# Patient Record
Sex: Female | Born: 1965 | Race: White | Hispanic: No | Marital: Married | Smoking: Never smoker
Health system: Southern US, Community
[De-identification: ages and names within clinical notes are randomized; demographics above are authoritative.]

## PROBLEM LIST (undated history)

## (undated) DIAGNOSIS — L259 Unspecified contact dermatitis, unspecified cause: Secondary | ICD-10-CM

## (undated) DIAGNOSIS — I493 Ventricular premature depolarization: Secondary | ICD-10-CM

## (undated) DIAGNOSIS — R42 Dizziness and giddiness: Secondary | ICD-10-CM

## (undated) DIAGNOSIS — D682 Hereditary deficiency of other clotting factors: Secondary | ICD-10-CM

## (undated) DIAGNOSIS — R002 Palpitations: Secondary | ICD-10-CM

## (undated) DIAGNOSIS — E041 Nontoxic single thyroid nodule: Secondary | ICD-10-CM

## (undated) DIAGNOSIS — D6859 Other primary thrombophilia: Secondary | ICD-10-CM

## (undated) HISTORY — DX: Ventricular premature depolarization: I49.3

## (undated) HISTORY — DX: Palpitations: R00.2

## (undated) HISTORY — DX: Nontoxic single thyroid nodule: E04.1

## (undated) HISTORY — DX: Unspecified contact dermatitis, unspecified cause: L25.9

## (undated) HISTORY — DX: Hereditary deficiency of other clotting factors: D68.2

## (undated) HISTORY — DX: Other primary thrombophilia: D68.59

## (undated) HISTORY — DX: Dizziness and giddiness: R42

## (undated) HISTORY — PX: OTHER SURGICAL HISTORY: SHX169

---

## 1997-12-23 ENCOUNTER — Inpatient Hospital Stay (HOSPITAL_COMMUNITY): Admission: AD | Admit: 1997-12-23 | Discharge: 1997-12-23 | Payer: Self-pay | Admitting: Obstetrics and Gynecology

## 1998-02-16 ENCOUNTER — Other Ambulatory Visit: Admission: RE | Admit: 1998-02-16 | Discharge: 1998-02-16 | Payer: Self-pay | Admitting: Obstetrics and Gynecology

## 1999-05-23 ENCOUNTER — Other Ambulatory Visit: Admission: RE | Admit: 1999-05-23 | Discharge: 1999-05-23 | Payer: Self-pay | Admitting: Obstetrics and Gynecology

## 2000-06-13 ENCOUNTER — Other Ambulatory Visit: Admission: RE | Admit: 2000-06-13 | Discharge: 2000-06-13 | Payer: Self-pay | Admitting: Obstetrics and Gynecology

## 2001-11-25 ENCOUNTER — Other Ambulatory Visit: Admission: RE | Admit: 2001-11-25 | Discharge: 2001-11-25 | Payer: Self-pay | Admitting: Obstetrics and Gynecology

## 2003-03-17 ENCOUNTER — Other Ambulatory Visit: Admission: RE | Admit: 2003-03-17 | Discharge: 2003-03-17 | Payer: Self-pay | Admitting: Obstetrics and Gynecology

## 2003-03-31 ENCOUNTER — Ambulatory Visit (HOSPITAL_COMMUNITY): Admission: RE | Admit: 2003-03-31 | Discharge: 2003-03-31 | Payer: Self-pay | Admitting: Obstetrics and Gynecology

## 2003-05-20 ENCOUNTER — Encounter (INDEPENDENT_AMBULATORY_CARE_PROVIDER_SITE_OTHER): Payer: Self-pay | Admitting: Specialist

## 2003-05-20 ENCOUNTER — Ambulatory Visit (HOSPITAL_COMMUNITY): Admission: RE | Admit: 2003-05-20 | Discharge: 2003-05-20 | Payer: Self-pay | Admitting: Surgery

## 2004-07-06 ENCOUNTER — Other Ambulatory Visit: Admission: RE | Admit: 2004-07-06 | Discharge: 2004-07-06 | Payer: Self-pay | Admitting: Obstetrics and Gynecology

## 2004-09-26 ENCOUNTER — Encounter (INDEPENDENT_AMBULATORY_CARE_PROVIDER_SITE_OTHER): Payer: Self-pay | Admitting: *Deleted

## 2004-09-26 ENCOUNTER — Ambulatory Visit (HOSPITAL_COMMUNITY): Admission: RE | Admit: 2004-09-26 | Discharge: 2004-09-26 | Payer: Self-pay | Admitting: Obstetrics and Gynecology

## 2008-03-12 ENCOUNTER — Ambulatory Visit (HOSPITAL_COMMUNITY): Admission: RE | Admit: 2008-03-12 | Discharge: 2008-03-12 | Payer: Self-pay | Admitting: Obstetrics and Gynecology

## 2009-02-22 LAB — CONVERTED CEMR LAB

## 2010-03-22 ENCOUNTER — Ambulatory Visit: Payer: Self-pay | Admitting: Internal Medicine

## 2010-03-23 DIAGNOSIS — E041 Nontoxic single thyroid nodule: Secondary | ICD-10-CM | POA: Insufficient documentation

## 2010-03-23 DIAGNOSIS — D6859 Other primary thrombophilia: Secondary | ICD-10-CM | POA: Insufficient documentation

## 2010-03-23 DIAGNOSIS — L259 Unspecified contact dermatitis, unspecified cause: Secondary | ICD-10-CM | POA: Insufficient documentation

## 2010-05-14 ENCOUNTER — Encounter: Payer: Self-pay | Admitting: Obstetrics and Gynecology

## 2010-05-26 NOTE — Assessment & Plan Note (Signed)
Summary: new pt/uhc/#/lb   Vital Signs:  Patient profile:   45 year old female Height:      59 inches (149.86 cm) Weight:      127.4 pounds (57.91 kg) BMI:     25.82 O2 Sat:      99 % on Room air Temp:     98.1 degrees F (36.72 degrees C) oral Pulse rate:   68 / minute BP sitting:   100 / 60  (left arm) Cuff size:   regular  Vitals Entered By: Orlan Leavens RMA (March 22, 2010 3:04 PM)  O2 Flow:  Room air CC: New patient Is Patient Diabetic? No Pain Assessment Patient in pain? no        Primary Care Provider:  Newt Lukes MD  CC:  New patient.  History of Present Illness: new pt to me and our practice, here to est with PCP - follows with gyn who provides majority of medical care  c/o itchy and scaling rash affects posterior scalp line and right ear (pinna fold against scalp) +hx same - similar to prior excema flares onset current symptoms 2-3 weeks ago but also milder chronic component at hairline has not responded to topical steroid "foam" due to burnng pain when applied - inconstistent use worse with heat and direct pressure (cell phone to ear) no other rash involvment on head or trunk at this time no fever, no HA, no hx psoriasis  Preventive Screening-Counseling & Management  Alcohol-Tobacco     Alcohol drinks/day: 0     Alcohol Counseling: not indicated; patient does not drink     Smoking Status: never     Tobacco Counseling: not indicated; no tobacco use  Caffeine-Diet-Exercise     Does Patient Exercise: yes     Exercise Counseling: not indicated; exercise is adequate     Depression Counseling: not indicated; screening negative for depression  Safety-Violence-Falls     Seat Belt Counseling: not indicated; patient wears seat belts     Helmet Counseling: not indicated; patient wears helmet when riding bicycle/motocycle     Firearm Counseling: not applicable     Violence Counseling: not indicated; no violence risk noted  Clinical Review  Panels:  Prevention   Last Mammogram:  No specific mammographic evidence of malignancy.   (02/22/2009)   Last Pap Smear:  Interpretation. /Result:Negative for intraepithelial Lesion or Malignancy.    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 I have reviewed all other systems and they were negative.   Physical Exam  General:  alert, well-developed, well-nourished, and cooperative to examination.    Head:  Normocephalic and atraumatic without obvious abnormalities. No apparent alopecia or balding. Eyes:  vision grossly intact; pupils equal, round and reactive to light.  conjunctiva and lids normal.    Ears:  normal pinnae bilaterally, without erythema, swelling, mild tenderness to palpation on right pinna. TMs clear, without effusion, or cerumen impaction. Hearing grossly normal bilaterally  Mouth:  teeth  and gums in good repair; mucous membranes moist, without lesions or ulcers. oropharynx clear without exudate, no erythema.  Lungs:  normal respiratory effort, no intercostal retractions or use of accessory muscles; normal breath sounds bilaterally - no crackles and no wheezes.    Heart:  normal rate, regular rhythm, no murmur, and no rub. BLE without edema. Genitalia:  defer to gyn Skin:  scaling excema at posterior scalp line and hairline behind right ear where there is cracking and mild edema, no cellulitis or bleeding or infx Psych:  Oriented X3, memory intact for  recent and remote, normally interactive, good eye contact, not anxious appearing, not depressed appearing, and not agitated.      Impression & Recommendations:  Problem # 1:  ECZEMA (ICD-692.9)  rx for nonalcohol based steroid to lessen burn sensation Her updated medication list for this problem includes:    Olux 0.05 % Foam (Clobetasol propionate) .Marland Kitchen... Apply to scalp as needed    Triamcinolone Acetonide 0.5 % Crea (Triamcinolone acetonide) .Marland Kitchen... 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 Apply to scalp as needed 2)  Triamcinolone Acetonide 0.5 % Crea (Triamcinolone acetonide) .... Apply to ear rash three times a day as needed for itch  Patient Instructions: 1)  it was good to see you today. 2)  use triamcinolone cream for ear itch/rash - your prescription has been electronically submitted to your pharmacy. Please take as directed. Contact our office if you believe you're having problems with the medication(s).  3)  please ask dr. Arelia Sneddon to fax [place] copies of any visits and test results that he may do in future visits 4)  Please schedule a follow-up appointment as needed. Prescriptions: TRIAMCINOLONE ACETONIDE 0.5 % CREA (TRIAMCINOLONE ACETONIDE) apply to ear rash three times a day as needed for itch  #1 x 0   Entered and Authorized by:   Newt Lukes MD   Signed by:   Newt Lukes MD on 03/22/2010   Method used:   Electronically to        [facility]  [address] [phone]* (retail)       [address] [place], [place]  [address]       Ph: [phone] or [phone]       Fax: [phone]   RxID:   [id number]    Orders Added: 1)  New Patient Level III [99203] 2)  Prescription Created Electronically [phone]     Mammogram  Procedure date:  02/22/2009  Findings:      No specific mammographic evidence of malignancy.    Pap Smear  Procedure date:  02/22/2009  Findings:      Interpretation. /Result:Negative for intraepithelial Lesion or Malignancy.

## 2010-09-09 NOTE — Op Note (Signed)
Veronica Chen, Veronica Chen                ACCOUNT NO.:  [id number]   MEDICAL RECORD NO.:  [id number]          PATIENT TYPE:  AMB   LOCATION:  SDC                           FACILITY:  [facility]   PHYSICIAN:  Juluis Mire, M.D.   DATE OF BIRTH:  [birth year]   DATE OF PROCEDURE:  09/26/2004  DATE OF DISCHARGE:                                 OPERATIVE REPORT   PREOPERATIVE DIAGNOSES:  1.  Menorrhagia.  2.  Uterine subseptum.   POSTOPERATIVE DIAGNOSES:  1.  Menorrhagia.  2.  Uterine subseptum.   OPERATIVE PROCEDURE:  1.  Hysteroscopy.  2.  Endometrial curettings.  3.  NovaSure endometrial ablation.   ANESTHESIA:  Sedation with paracervical block.   ESTIMATED BLOOD LOSS:  Minimal.   PACKS AND DRAINS:  None.   INTRAOPERATIVE BLOOD LOSS:  None.   COMPLICATIONS:  None.   INDICATION:  The indications are dictated in the history and physical.   PROCEDURE:  The patient was taken to the OR and placed in supine position.  After sedation was placed, the patient was placed in the dorsal supine  position using the Allen stirrups.  At this point in time, the patient was  draped in a sterile field.  Speculum was placed in the vaginal vault and the  cervix and vagina cleansed with Betadine.  A paracervical block was  instituted using 1% Nesacaine.  The cervix was grasped with a single-tooth  tenaculum.  The uterus sounded to approximately 9 cm.  Cervical length was 4  cm.  The cervix was serially dilated to a size 27 Pratt dilator.  Nonoperative hysteroscope was then introduced and the intrauterine cavity  was distended using sorbitol.  There was a small fundal subseptum.  Otherwise, the intrauterine cavity was unremarkable.  Endometrial curettings  were then obtained.  NovaSure was set to appropriate length of 5 cm.  It was  inserted and appropriately deployed.  After manipulation, the cavity width  was 5 cm.  We passed the CO2 test.  We subsequently ablated to a power of  138 for 102 seconds.   The hysteroscope was then removed intact.  Repeat  hysteroscopy revealed both cornua to have been adequately ablated despite  the septum.  At this point in time, the procedure was terminated.  The  speculum and  single-tooth tenaculum were removed.  The patient was taken out of the  dorsal supine position.  Once alert, she was transferred to recovery in good  condition.  Sponge, instrument, and needle count reported as correct by the  circulating nurse x2.       JSM/MEDQ  D:  09/26/2004  T:  09/26/2004  Job:  [id number]

## 2010-09-09 NOTE — H&P (Signed)
NAMEADYSON, VANBUREN NO.:  [id number]   MEDICAL RECORD NO.:  [id number]          PATIENT TYPE:  AMB   LOCATION:  SDC                           [facility]ILITY:  [facility]   PHYSICIAN:  Juluis Mire, M.D.   DATE OF BIRTH:  April 17, 1966   DATE OF ADMISSION:  09/26/2004  DATE OF DISCHARGE:                                HISTORY & PHYSICAL   The patient is a 45 year old gravida 2, para 2, married white female, who  presents for hysteroscopy, as well as NovaSure ablation.   In relation to present admission, cycles last from 7-10 days.  They are  heavy and prolonged.  Her hemoglobin has remained stable.  She denies any  pelvic pain or discomfort.  A saline infusion ultrasound was basically  unremarkable.  She she does have a positive subseptate uterus and findings  highly suggestive of adenomyosis.  The patient has a history of protein S  deficiency, therefore hormonal agents are contraindicated.  After discussion  of options, the patient now presents for endometrial ablation.  She does  understand with the septum, success rates may be slightly lessened.  After  the ablation, we may try to go in with the roller ball and ablate the  corneal areas.   In terms of allergies, the patient has no known drug allergies.   MEDICATIONS:  Minocycline for acne.   PAST MEDICAL HISTORY:  Usual childhood diseases.  Does have a history of protein S deficiency.  Also a history of thyroid nodule under active management.   PAST SURGICAL HISTORY:  She has had dermatological surgery.  She has had two previous C sections, the last one with a bilateral tubal  ligation.   FAMILY HISTORY:  Noncontributory.   SOCIAL HISTORY:  No tobacco or alcohol use.   REVIEW OF SYSTEMS:  Noncontributory.   PHYSICAL EXAMINATION:  GENERAL:  The patient is afebrile with stable vital  signs.  HEENT:  The patient is normocephalic.  Pupils are equal, round and reactive  to light and accommodation.  Extraocular  movements are intact.  Sclerae and  conjunctivae clear.  Oropharynx clear.  NECK:  Some thyroid nodularity, otherwise unremarkable.  LUNGS:  Clear.  HEART:  Regular rate.  No murmurs or gallops.  ABDOMEN:  Exam is benign.  No mass, organomegaly or tenderness.  PELVIC:  Normal external genitalia.  Vaginal mucosa clear.  Cervix  unremarkable.  Uterus normal size, shape and contour.  Adnexa re free of  masses or tenderness.  EXTREMITIES:  Trace edema.  NEUROLOGIC:  Exam is grossly within normal limits.   IMPRESSION:  1.  Menorrhagia, probable adenomyosis.  2.  Protein S deficiency.  3.  Thyroid nodule.   PLAN:  The patient will undergo the above noted surgery.  The risks of  surgery have been discussed including the risk of infection, the risk of  hemorrhage that could require transfusion with the possible need of  hysterectomy, the risk of injury to adjacent organs through perforation,  specifically bowel that could require further exploratory surgery, the risk  of deep venous thrombosis and pulmonary  embolus.  Success rates of 89% are  quoted.  The patient appears to understand the risks.       JSM/MEDQ  D:  09/26/2004  T:  09/26/2004  Job:  [id number]

## 2010-09-21 ENCOUNTER — Other Ambulatory Visit (HOSPITAL_COMMUNITY): Payer: Self-pay | Admitting: Obstetrics and Gynecology

## 2010-09-21 DIAGNOSIS — E041 Nontoxic single thyroid nodule: Secondary | ICD-10-CM

## 2010-09-23 ENCOUNTER — Other Ambulatory Visit (HOSPITAL_COMMUNITY): Payer: Self-pay

## 2011-08-23 ENCOUNTER — Encounter: Payer: Self-pay | Admitting: Internal Medicine

## 2011-08-23 ENCOUNTER — Ambulatory Visit (INDEPENDENT_AMBULATORY_CARE_PROVIDER_SITE_OTHER): Payer: 59 | Admitting: Internal Medicine

## 2011-08-23 VITALS — BP 100/78 | HR 79 | Temp 98.2°F | Ht 59.5 in | Wt 125.0 lb

## 2011-08-23 DIAGNOSIS — H6120 Impacted cerumen, unspecified ear: Secondary | ICD-10-CM

## 2011-08-23 DIAGNOSIS — H6121 Impacted cerumen, right ear: Secondary | ICD-10-CM

## 2011-08-23 DIAGNOSIS — R42 Dizziness and giddiness: Secondary | ICD-10-CM

## 2011-08-23 NOTE — Patient Instructions (Signed)
It was good to see you today. we will send to your prior provider(s) at [facility] for "release of records" as discussed today -  Your ears have been irrigated of wax today -let us know if continued dizzy spells or hearing problems persist for referral to audiologist and hearing testing  Benign Positional Vertigo Vertigo means you feel like you or your surroundings are moving when they are not. Benign positional vertigo is the most common form of vertigo. Benign means that the cause of your condition is not serious. Benign positional vertigo is more common in older adults. CAUSES   Benign positional vertigo is the result of an upset in the labyrinth system. This is an area in the middle ear that helps control your balance. This may be caused by a viral infection, head injury, or repetitive motion. However, often no specific cause is found. SYMPTOMS   Symptoms of benign positional vertigo occur when you move your head or eyes in different directions. Some of the symptoms may include:  Loss of balance and falls.   Vomiting.   Blurred vision.   Dizziness.   Nausea.   Involuntary eye movements (nystagmus).  DIAGNOSIS   Benign positional vertigo is usually diagnosed by physical exam. If the specific cause of your benign positional vertigo is unknown, your caregiver may perform imaging tests, such as magnetic resonance imaging (MRI) or computed tomography (CT). TREATMENT   Your caregiver may recommend movements or procedures to correct the benign positional vertigo. Medicines such as meclizine, benzodiazepines, and medicines for nausea may be used to treat your symptoms. In rare cases, if your symptoms are caused by certain conditions that affect the inner ear, you may need surgery. HOME CARE INSTRUCTIONS    Follow your caregiver's instructions.   Move slowly. Do not make sudden body or head movements.   Avoid driving.   Avoid operating heavy machinery.   Avoid performing any tasks  that would be dangerous to you or others during a vertigo episode.   Drink enough fluids to keep your urine clear or pale yellow.  SEEK IMMEDIATE MEDICAL CARE IF:    You develop problems with walking, weakness, numbness, or using your arms, hands, or legs.   You have difficulty speaking.   You develop severe headaches.   Your nausea or vomiting continues or gets worse.   You develop visual changes.   Your family or friends notice any behavioral changes.   Your condition gets worse.   You have a fever.   You develop a stiff neck or sensitivity to light.  MAKE SURE YOU:    Understand these instructions.   Will watch your condition.   Will get help right away if you are not doing well or get worse.  Document Released: 01/16/2006 Document Revised: 03/30/2011 Document Reviewed: 12/29/2010 [facility] Patient Information 2012 [place], [place].

## 2011-08-23 NOTE — Progress Notes (Signed)
  Subjective:    Patient ID: Veronica Chen, female    DOB: 1965-12-12, 46 y.o.   MRN: [id number]  HPI  complains of dizzy spell last week Hx same spring 2012 and severe spell in 2011 associated with mild nausea - no headache or weakness symptoms last <3 hours -  Spontaneous onset and gradual resolution without intervention Prior meds in 2011 (meclizine) ineffective Feels "full" on r side of ear  Past Medical History  Diagnosis Date  . 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 ECZEMA   . THYROID NODULE     Review of Systems  Constitutional: Negative for fever.  HENT: Negative for ear pain and tinnitus.   Respiratory: Negative for cough and wheezing.   Cardiovascular: Negative for chest pain, palpitations and leg swelling.  Neurological: Negative for syncope and facial asymmetry.       Objective:   Physical Exam BP 100/78  Pulse 79  Temp(Src) 98.2 F (36.8 C) (Oral)  Ht 4' 11.5" (1.511 m)  Wt 125 lb (56.7 kg)  BMI 24.82 kg/m2  SpO2 97% Wt Readings from Last 3 Encounters:  08/23/11 125 lb (56.7 kg)  03/22/10 127 lb 6.4 oz (57.788 kg)   Constitutional: She appears well-developed and well-nourished. No distress.  HENT: Head: Normocephalic and atraumatic. Ears: R ear with obstructing cerumen; after irrigation, B TMs ok, no erythema or effusion; Nose: Nose normal. Mouth/Throat: Oropharynx is clear and moist. No oropharyngeal exudate.  Eyes: Conjunctivae and EOM are normal. Pupils are equal, round, and reactive to light. No scleral icterus.  Neck: Normal range of motion. Neck supple. No JVD present. No thyromegaly present.  Cardiovascular: Normal rate, regular rhythm and normal heart sounds.  No murmur heard. No BLE edema. Pulmonary/Chest: Effort normal and breath sounds normal. No respiratory distress. She has no wheezes.  Neurological: She is alert and oriented to person, place, and time. No cranial nerve deficit. Coordination  normal.  Psychiatric: She has a normal mood and affect. Her behavior is normal. Judgment and thought content normal.   No results found for this basename: WBC, HGB, HCT, PLT, GLUCOSE, CHOL, TRIG, HDL, LDLDIRECT, LDLCALC, ALT, AST, NA, K, CL, CREATININE, BUN, CO2, TSH, PSA, INR, GLUF, HGBA1C, MICROALBUR   Procedure: wax removal Reason: wax impaction Risks and benefits of procedure discussed with the patient who agrees to proceed. Ear(s) irrigated with warm water. Large amount of wax removed. Instrumentation with metal ear loop was performed to accomplish wax removal. the patient tolerated procedure well.     Assessment & Plan:  Dizzy - suspect BPPV vs symptomatic cerumen - declines meds.  S/p irrigation - education povided Pt to call if symptoms worse or frequent recurrent

## 2011-08-30 ENCOUNTER — Encounter: Payer: Self-pay | Admitting: *Deleted

## 2011-10-31 ENCOUNTER — Ambulatory Visit (INDEPENDENT_AMBULATORY_CARE_PROVIDER_SITE_OTHER): Payer: 59 | Admitting: Internal Medicine

## 2011-10-31 ENCOUNTER — Encounter: Payer: Self-pay | Admitting: Internal Medicine

## 2011-10-31 VITALS — BP 102/72 | HR 73 | Temp 98.4°F | Ht 60.0 in | Wt 128.8 lb

## 2011-10-31 DIAGNOSIS — L301 Dyshidrosis [pompholyx]: Secondary | ICD-10-CM

## 2011-10-31 MED ORDER — CLOBETASOL PROPIONATE 0.05 % EX CREA: TOPICAL_CREAM | Freq: Two times a day (BID) | CUTANEOUS | Status: AC

## 2011-10-31 NOTE — Progress Notes (Signed)
  Subjective:    Patient ID: Veronica Chen, female    DOB: 04-Dec-1965, 46 y.o.   MRN: [id number]  HPI complains of itchy sensation and "water bumbs" around toenail of R 3rd toe tip ?associated with accidental nail injury or flea exposure Onset 4 days ago Not improved with topical antifungal No other skin rash, itch or hx same  Past Medical History  Diagnosis Date  . 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 ECZEMA   . THYROID NODULE   . Palpitations   . PVC's (premature ventricular contractions)   . Vertigo   . Protein S deficiency     Review of Systems  Constitutional: Negative for fever.  Musculoskeletal: Negative for joint swelling.       Objective:   Physical Exam Gen: NAD, nontoxic Skin: distal R 3rd toe, dorsal surface with dishydrotic eczema  Lesions- no fungal change or cellulitis, no soft tissue swelling or darkening       Assessment & Plan:  Eczema, dyshidrotic - hx eczema - tx topical steroid - erx done and reassurance provided

## 2011-10-31 NOTE — Patient Instructions (Addendum)
It was good to see you today.  Steroid cream for your toe - Your prescription(s) have been submitted to your pharmacy. Please take as directed and contact our office if you believe you are having problem(s) with the medication(s).

## 2012-01-03 ENCOUNTER — Telehealth: Payer: Self-pay | Admitting: Internal Medicine

## 2012-01-03 NOTE — Telephone Encounter (Signed)
Caller: Ryver/Patient; Patient Name: Veronica Chen, Veronica Chen; PCP: Rene Paci (Adults only); Best Callback Phone Number: [phone];  Last Menstrual Period: 12/22/11. Patient states she developed allergy symptoms of itchy eyes, runny nose. Onset X 2 weeks. Afebrile. Patient states she started a generic, over the counter, 24 hour allergy/decongestant medication 01/01/12. Patient states she noted symptoms of rapid, irregular heart beat, describes as a "flutter". Onset 01/02/12. States symptoms are intermittent, lasting "just a second." Patient denies chest pain, dizziness or shortness of breath. Patient denies irregular or rapid heart beat at present. Patient states she was previously evaluated for irregular heart rate, approximately 3 years ago. Triage per Allergies and Irregular Heart Beat Protocol. No emergent symptoms identified. Care advice given per guidelines related to positive triage assessment for " Symptoms have not responded to several days of home care" and Patient has " unexplianed nervousness and pulse is irregular." Patient advised to avoid use of over the counter decongestants. Patient advised saline nasal washes/netty pot, inhaled steam, humidifier. Patient advised increased fluids, change positions slowly. Call back parameters reviewed. Patient verbalizes understanding. Patient states sh has an appointment scheduled for 01/04/12 0915 with Dr Felicity Coyer.

## 2012-01-04 ENCOUNTER — Encounter: Payer: Self-pay | Admitting: Internal Medicine

## 2012-01-04 ENCOUNTER — Ambulatory Visit (INDEPENDENT_AMBULATORY_CARE_PROVIDER_SITE_OTHER): Payer: 59 | Admitting: Internal Medicine

## 2012-01-04 VITALS — BP 110/70 | HR 89 | Temp 97.0°F | Resp 14 | Wt 128.0 lb

## 2012-01-04 DIAGNOSIS — J309 Allergic rhinitis, unspecified: Secondary | ICD-10-CM | POA: Insufficient documentation

## 2012-01-04 DIAGNOSIS — R002 Palpitations: Secondary | ICD-10-CM

## 2012-01-04 DIAGNOSIS — M899 Disorder of bone, unspecified: Secondary | ICD-10-CM

## 2012-01-04 DIAGNOSIS — M8580 Other specified disorders of bone density and structure, unspecified site: Secondary | ICD-10-CM | POA: Insufficient documentation

## 2012-01-04 MED ORDER — LORATADINE 10 MG PO TABS: 10.0000 mg | ORAL_TABLET | Freq: Every day | ORAL | Status: DC

## 2012-01-04 NOTE — Assessment & Plan Note (Signed)
Seasonal symptoms Palpitations with decongestant component of claritinD 24-hour Advised antihistamine without decongestant component Okay for short acting pseudoephedrine if needed for congestion symptoms

## 2012-01-04 NOTE — Patient Instructions (Addendum)
It was good to see you today. Her EKG and her tracing is perfectly normal Your patient decongestant and allergy medicine, this is a normal side effect for some people. For your allergy symptoms, use plain Claritin Zyrtec or Allegra - avoid "decongestants"  Unless short acting Sudafed (red pill) After this fall, we will follow your thyroid and physical labs needs here (rather than at gynecology) Please schedule followup in 1 year, call sooner if problems. Palpitations   A palpitation is the feeling that your heartbeat is irregular or is faster than normal. Although this is frightening, it usually is not serious. Palpitations may be caused by excesses of smoking, caffeine, or alcohol. They are also brought on by stress and anxiety. Sometimes, they are caused by heart disease. Unless otherwise noted, your caregiver did not find any signs of serious illness at this time. HOME CARE INSTRUCTIONS   To help prevent palpitations:  Drink decaffeinated coffee, tea, and soda pop. Avoid chocolate.   If you smoke or drink alcohol, quit or cut down as much as possible.   Reduce your stress or anxiety level. Biofeedback, yoga, or meditation will help you relax. Physical activity such as swimming, jogging, or walking also may be helpful.  SEEK MEDICAL CARE IF:    You continue to have a fast heartbeat.   Your palpitations occur more often.  SEEK IMMEDIATE MEDICAL CARE IF: You develop chest pain, shortness of breath, severe headache, dizziness, or fainting. Document Released: 04/07/2000 Document Revised: 03/30/2011 Document Reviewed: 06/07/2007 [facility] Patient Information 2012 [place], [place].

## 2012-01-04 NOTE — Progress Notes (Signed)
  Subjective:    Patient ID: Veronica Chen, female    DOB: Mar 26, 1966, 46 y.o.   MRN: [id number]  Palpitations  This is a recurrent problem. The current episode started in the past 7 days. The problem occurs intermittently. The problem has been rapidly improving. On average, each episode lasts 3 seconds. The symptoms are aggravated by pseudoephedrine and stress. Pertinent negatives include no anxiety, chest pain, coughing, diaphoresis, dizziness, fever, malaise/fatigue, near-syncope, shortness of breath or weakness. Treatments tried: Discontinuation of decongestant. The treatment provided significant relief. Risk factors include no known risk factors. There is no history of anemia, anxiety, heart disease, hyperthyroidism or a valve disorder.     Past Medical History  Diagnosis Date  . 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 ECZEMA   . THYROID NODULE   . Palpitations   . PVC's (premature ventricular contractions)   . Vertigo   . Protein S deficiency     Review of Systems  Constitutional: Negative for fever, malaise/fatigue and diaphoresis.  HENT: Positive for sneezing and postnasal drip. Negative for ear pain, congestion, sinus pressure and tinnitus.   Respiratory: Negative for cough, shortness of breath and wheezing.   Cardiovascular: Positive for palpitations. Negative for chest pain, leg swelling and near-syncope.  Neurological: Negative for dizziness, syncope, facial asymmetry and weakness.       Objective:   Physical Exam  BP 110/70  Pulse 89  Temp 97 F (36.1 C) (Oral)  Resp 14  Wt 128 lb (58.06 kg)  SpO2 98% Wt Readings from Last 3 Encounters:  01/04/12 128 lb (58.06 kg)  10/31/11 128 lb 12.8 oz (58.423 kg)  08/23/11 125 lb (56.7 kg)   Constitutional: She appears well-developed and well-nourished. No distress.  Neck: Normal range of motion. Neck supple. No JVD present. No thyromegaly present.  Cardiovascular: Normal rate,  regular rhythm and normal heart sounds - single PVC auscultated corresponding with patient's sensation of flutter.  No murmur heard. No BLE edema. Pulmonary/Chest: Effort normal and breath sounds normal. No respiratory distress. She has no wheezes.  Neurological: She is alert and oriented to person, place, and time. No cranial nerve deficit. Coordination normal.  Psychiatric: She has a normal mood and affect. Her behavior is normal. Judgment and thought content normal.   No results found for this basename: WBC,  HGB,  HCT,  PLT,  GLUCOSE,  CHOL,  TRIG,  HDL,  LDLDIRECT,  LDLCALC,  ALT,  AST,  NA,  K,  CL,  CREATININE,  BUN,  CO2,  TSH,  PSA,  INR,  GLUF,  HGBA1C,  MICROALBUR   ECG: Normal sinus rhythm at 81 bpm. No ischemic change    Assessment & Plan:  Symptomatic palpitations and PVCs - current episode precipitated by extended release decongestant and allergy medication. Symptoms resolved in past 24 hours since discontinuation of Claritin-D 24h. ECG reviewed. The patient is reassured that these symptoms do not appear to represent a serious or threatening condition.  See problem list. Medications and labs reviewed today.

## 2012-01-04 NOTE — Assessment & Plan Note (Signed)
Patient reports mild bone loss on DEXA at gynecology, reversed to normal with increase in weightbearing exercise We'll ask gynecology to send copy of last DEXA, follow here as needed if no longer following with gynecology

## 2012-02-16 ENCOUNTER — Other Ambulatory Visit (HOSPITAL_COMMUNITY): Payer: Self-pay | Admitting: Obstetrics and Gynecology

## 2012-02-16 DIAGNOSIS — E041 Nontoxic single thyroid nodule: Secondary | ICD-10-CM

## 2012-02-21 ENCOUNTER — Ambulatory Visit (HOSPITAL_COMMUNITY)
Admission: RE | Admit: 2012-02-21 | Discharge: 2012-02-21 | Disposition: A | Discharge status: Home / Self Care | Payer: 59 | Source: Ambulatory Visit | Attending: Obstetrics and Gynecology | Admitting: Obstetrics and Gynecology

## 2012-02-21 DIAGNOSIS — E049 Nontoxic goiter, unspecified: Secondary | ICD-10-CM | POA: Insufficient documentation

## 2012-02-21 DIAGNOSIS — E041 Nontoxic single thyroid nodule: Secondary | ICD-10-CM

## 2012-03-19 ENCOUNTER — Telehealth: Payer: Self-pay | Admitting: *Deleted

## 2012-03-19 ENCOUNTER — Ambulatory Visit (INDEPENDENT_AMBULATORY_CARE_PROVIDER_SITE_OTHER): Payer: 59 | Admitting: Internal Medicine

## 2012-03-19 ENCOUNTER — Encounter: Payer: Self-pay | Admitting: Internal Medicine

## 2012-03-19 VITALS — BP 118/84 | HR 100 | Temp 98.2°F | Ht 60.0 in | Wt 125.0 lb

## 2012-03-19 MED ORDER — LIDOCAINE VISCOUS 2 % MT SOLN: 20.0000 mL | OROMUCOSAL | Status: DC | PRN

## 2012-03-19 MED ORDER — RANITIDINE HCL 150 MG PO TABS: 150.0000 mg | ORAL_TABLET | Freq: Two times a day (BID) | ORAL | Status: DC

## 2012-03-19 MED ORDER — LIDOCAINE HCL 2 % EX GEL: Freq: Three times a day (TID) | CUTANEOUS | Status: DC

## 2012-03-19 MED ORDER — SUCRALFATE 1 GM/10ML PO SUSP: 1.0000 g | Freq: Four times a day (QID) | ORAL | Status: DC

## 2012-03-19 NOTE — Telephone Encounter (Signed)
Pt states saw md this am. Gave 3 rx's wanted to verify if the lidocaine should be topical because pt states md told her when she swallow medicine feel like she going to dentist. Should lidocaine be topical or solution...lmb

## 2012-03-19 NOTE — Telephone Encounter (Signed)
Pt called again requesting clarification on lidocaine solution after pharmacist advised pt not to take. After speaking with pharmacist Jonny Ruiz I was informed that he incorrectly advised pt not to take corrected Rx for Lidocaine Viscous solution because he was not accustomed to dispensing it undiluted but he would contact the patient to discuss. Pt advised of same and expressed understanding.

## 2012-03-19 NOTE — Telephone Encounter (Signed)
Thanks for the note - please contact the pharmacy as i sent the wrong lidocaine 1st time: it should be viscous lidocaine solution, not topical gel. i have re-sent the correct viscous lidocaine to [facility] - please let them and pt know same  thanks!

## 2012-03-19 NOTE — Progress Notes (Signed)
  Subjective:    Patient ID: Veronica Chen, female    DOB: 1965/11/12, 46 y.o.   MRN: [id number]  HPI  Complains of substernal chest pain Onset 72 hours ago Associated with painful swallowing Precipitated by single tablet of doxycycline taken 6 hours prior to symptom onset No prior history of same chest pain No oral pain or sore throat, no mouth lesion No fever, cough, shortness of breath No nausea, vomiting or abdominal pain No dysphasia but increasing odynophagia last 48 hours Symptoms not relieved with over-the-counter TUMS or Maalox  Past Medical History  Diagnosis Date  . 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 ECZEMA   . THYROID NODULE   . Palpitations   . PVC's (premature ventricular contractions)   . Vertigo   . Protein S deficiency    Review of Systems  Constitutional: Negative for fever and fatigue.  Respiratory: Negative for cough, shortness of breath, wheezing and stridor.   Gastrointestinal: Negative for abdominal pain and blood in stool.       Objective:   Physical Exam BP 118/84  Pulse 100  Temp 98.2 F (36.8 C) (Oral)  Ht 5' (1.524 m)  Wt 125 lb (56.7 kg)  BMI 24.41 kg/m2  SpO2 99%  LMP 03/04/2012 Wt Readings from Last 3 Encounters:  03/19/12 125 lb (56.7 kg)  01/04/12 128 lb (58.06 kg)  10/31/11 128 lb 12.8 oz (58.423 kg)   Constitutional: She appears well-developed and well-nourished. No distress.  HENT: Head: Normocephalic and atraumatic.  Mouth/Throat: Oropharynx is clear and moist. No oropharyngeal exudate.  Eyes: Conjunctivae and EOM are normal. Pupils are equal, round, and reactive to light. No scleral icterus.  Neck: Normal range of motion. Neck supple. No JVD or LAD present. No thyromegaly present.  Cardiovascular: Normal rate, regular rhythm and normal heart sounds.  No murmur heard. No BLE edema. Pulmonary/Chest: Effort normal and breath sounds normal. No respiratory distress. She has no  wheezes.  Abdominal: Soft. Bowel sounds are normal. She exhibits no distension. There is no tenderness. 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

## 2012-03-19 NOTE — Telephone Encounter (Signed)
Called pt back she didn't pick first lidocaine rx up. Inform her [facility]...lmb

## 2012-03-19 NOTE — Patient Instructions (Signed)
It was good to see you today. Carafate 4x/day x 1 week, zantac 2x/day x 1 week and lidocaine gel if needed for pain Your prescription(s) have been submitted to your pharmacy. Please take as directed and contact our office if you believe you are having problem(s) with the medication(s). No more doxycycline Esophagitis Esophagitis is inflammation of the esophagus. It can involve swelling, soreness, and pain in the esophagus. This condition can make it difficult and painful to swallow. CAUSES   Most causes of esophagitis are not serious. Many different factors can cause esophagitis, including:  Gastroesophageal reflux disease (GERD). This is when acid from your stomach flows up into the esophagus.   Recurrent vomiting.   An allergic-type reaction.   Certain medicines, especially those that come in large pills.   Ingestion of harmful chemicals, such as household cleaning products.   Heavy alcohol use.   An infection of the esophagus.   Radiation treatment for cancer.   Certain diseases such as sarcoidosis, Crohn's disease, and scleroderma. These diseases may cause recurrent esophagitis.  SYMPTOMS    Trouble swallowing.   Painful swallowing.   Chest pain.   Difficulty breathing.   Nausea.   Vomiting.   Abdominal pain.  DIAGNOSIS   Your caregiver will take your history and do a physical exam. Depending upon what your caregiver finds, certain tests may also be done, including:  Barium X-ray. You will drink a solution that coats the esophagus, and X-rays will be taken.   Endoscopy. A lighted tube is put down the esophagus so your caregiver can examine the area.   Allergy tests. These can sometimes be arranged through follow-up visits.  TREATMENT   Treatment will depend on the cause of your esophagitis. In some cases, steroids or other medicines may be given to help relieve your symptoms or to treat the underlying cause of your condition. Medicines that may be recommended  include:  Viscous lidocaine, to soothe the esophagus.   Antacids.   Acid reducers.   Proton pump inhibitors.   Antiviral medicines for certain viral infections of the esophagus.   Antifungal medicines for certain fungal infections of the esophagus.   Antibiotic medicines, depending on the cause of the esophagitis.  HOME CARE INSTRUCTIONS    Avoid foods and drinks that seem to make your symptoms worse.   Eat small, frequent meals instead of large meals.   Avoid eating for the 3 hours prior to your bedtime.   If you have trouble taking pills, use a pill splitter to decrease the size and likelihood of the pill getting stuck or injuring the esophagus on the way down. Drinking water after taking a pill also helps.   Stop smoking if you smoke.   Maintain a healthy weight.   Wear loose-fitting clothing. Do not wear anything tight around your waist that causes pressure on your stomach.   Raise the head of your bed 6 to 8 inches with wood blocks to help you sleep. Extra pillows will not help.   Only take over-the-counter or prescription medicines as directed by your caregiver.  SEEK IMMEDIATE MEDICAL CARE IF:  You have severe chest pain that radiates into your arm, neck, or jaw.   You feel sweaty, dizzy, or lightheaded.   You have shortness of breath.   You vomit blood.   You have difficulty or pain with swallowing.   You have bloody or black, tarry stools.   You have a fever.   You have a burning sensation in  the chest more than 3 times a week for more than 2 weeks.   You cannot swallow, drink, or eat.   You drool because you cannot swallow your saliva.  MAKE SURE YOU:  Understand these instructions.   Will watch your condition.   Will get help right away if you are not doing well or get worse.  Document Released: 05/18/2004 Document Revised: 07/03/2011 Document Reviewed: 12/09/2010 [facility] Patient Information 2013 [place], [place].

## 2012-03-20 ENCOUNTER — Telehealth: Payer: Self-pay | Admitting: *Deleted

## 2012-03-20 NOTE — Telephone Encounter (Signed)
Pt call she is still concern about lidocaine rx that was sent in. Pt states she thought it would be like a mouth wash, but the solution is so thick like honey there is no way she can use med. She think pharmacy gave her wrong thing. Want to know what does med suppose to look like solution...lmb

## 2012-03-20 NOTE — Telephone Encounter (Signed)
Notified pt with md response.../lmb 

## 2012-03-20 NOTE — Telephone Encounter (Signed)
Viscous lidocaine is the consistency of honey.  This is the appropriate medication to swallow if needed for control of pain, but I am hopeful this medication will not be needed if she finds relief with Carafate slurry and Zantac as prescribed

## 2013-01-14 ENCOUNTER — Other Ambulatory Visit: Payer: Self-pay | Admitting: Podiatry

## 2013-01-14 DIAGNOSIS — M948X9 Other specified disorders of cartilage, unspecified sites: Secondary | ICD-10-CM

## 2013-01-21 ENCOUNTER — Ambulatory Visit
Admission: RE | Admit: 2013-01-21 | Discharge: 2013-01-21 | Disposition: A | Discharge status: Home / Self Care | Payer: 59 | Source: Ambulatory Visit | Attending: Podiatry | Admitting: Podiatry

## 2013-01-21 DIAGNOSIS — M948X9 Other specified disorders of cartilage, unspecified sites: Secondary | ICD-10-CM

## 2013-01-27 ENCOUNTER — Telehealth: Payer: Self-pay | Admitting: *Deleted

## 2013-01-27 NOTE — Telephone Encounter (Signed)
MRI results request.  I pulled results hard copy for Dr. Al Corpus, and informed pt, he would review and I would call with instructions.

## 2013-01-28 ENCOUNTER — Telehealth: Payer: Self-pay | Admitting: *Deleted

## 2013-01-28 NOTE — Telephone Encounter (Signed)
Pt states didn't like having to make an appt without knowing if the MRI results were abnormal.  I informed pt that Dr Al Corpus had requested she be seen in office to discus results and therapies, that the results were abnormal.

## 2013-02-04 ENCOUNTER — Encounter: Payer: Self-pay | Admitting: Podiatry

## 2013-02-04 ENCOUNTER — Ambulatory Visit (INDEPENDENT_AMBULATORY_CARE_PROVIDER_SITE_OTHER): Payer: 59 | Admitting: Podiatry

## 2013-02-04 VITALS — BP 110/66 | HR 81 | Resp 16 | Ht 60.0 in | Wt 128.0 lb

## 2013-02-04 DIAGNOSIS — M2580 Other specified joint disorders, unspecified joint: Secondary | ICD-10-CM

## 2013-02-04 DIAGNOSIS — M948X9 Other specified disorders of cartilage, unspecified sites: Secondary | ICD-10-CM

## 2013-02-04 DIAGNOSIS — M7750 Other enthesopathy of unspecified foot: Secondary | ICD-10-CM

## 2013-02-05 NOTE — Progress Notes (Signed)
Veronica Chen presents today to go over her MRI left foot, first metatarsophalangeal joint pain. 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 The radiologist read the MRI does relate osteoarthritis to the first metatarsophalangeal joint in general. He did seems very early. I discussed this in great detail with Mr. and Mrs. Birden today. I did discuss conservative versus surgical options with her. At this point she's going to continue anti-inflammatories as needed and she will work through the pain. She does relate that the pain is improving over the past few months. We did discuss surgical intervention consisting of tibial sesamoid excision should it become necessary. I did explain to her that the longer she has without surgery the worst this joint will become.  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

## 2013-02-26 ENCOUNTER — Ambulatory Visit: Payer: 59

## 2013-02-26 ENCOUNTER — Ambulatory Visit (INDEPENDENT_AMBULATORY_CARE_PROVIDER_SITE_OTHER): Payer: 59 | Admitting: *Deleted

## 2013-02-26 DIAGNOSIS — Z23 Encounter for immunization: Secondary | ICD-10-CM

## 2013-05-23 ENCOUNTER — Ambulatory Visit: Payer: 59 | Admitting: Internal Medicine

## 2013-08-13 ENCOUNTER — Ambulatory Visit: Payer: 59 | Admitting: Internal Medicine

## 2013-08-18 ENCOUNTER — Ambulatory Visit (INDEPENDENT_AMBULATORY_CARE_PROVIDER_SITE_OTHER)
Admission: RE | Admit: 2013-08-18 | Discharge: 2013-08-18 | Disposition: A | Discharge status: Home / Self Care | Payer: 59 | Source: Ambulatory Visit | Attending: Internal Medicine | Admitting: Internal Medicine

## 2013-08-18 ENCOUNTER — Other Ambulatory Visit (INDEPENDENT_AMBULATORY_CARE_PROVIDER_SITE_OTHER): Payer: 59

## 2013-08-18 ENCOUNTER — Encounter: Payer: Self-pay | Admitting: Internal Medicine

## 2013-08-18 ENCOUNTER — Ambulatory Visit (INDEPENDENT_AMBULATORY_CARE_PROVIDER_SITE_OTHER): Payer: 59 | Admitting: Internal Medicine

## 2013-08-18 VITALS — BP 112/82 | HR 84 | Temp 98.8°F | Wt 133.4 lb

## 2013-08-18 DIAGNOSIS — R0989 Other specified symptoms and signs involving the circulatory and respiratory systems: Secondary | ICD-10-CM

## 2013-08-18 DIAGNOSIS — R0609 Other forms of dyspnea: Secondary | ICD-10-CM

## 2013-08-18 DIAGNOSIS — R0600 Dyspnea, unspecified: Secondary | ICD-10-CM

## 2013-08-18 DIAGNOSIS — H6120 Impacted cerumen, unspecified ear: Secondary | ICD-10-CM

## 2013-08-18 DIAGNOSIS — J309 Allergic rhinitis, unspecified: Secondary | ICD-10-CM

## 2013-08-18 DIAGNOSIS — D682 Hereditary deficiency of other clotting factors: Secondary | ICD-10-CM

## 2013-08-18 LAB — BASIC METABOLIC PANEL
BUN: 12 mg/dL (ref 6–23)
CALCIUM: 9.3 mg/dL (ref 8.4–10.5)
CO2: 27 mEq/L (ref 19–32)
Chloride: 102 mEq/L (ref 96–112)
Creatinine, Ser: 0.8 mg/dL (ref 0.4–1.2)
GFR: 80.6 mL/min (ref >60.00)
Glucose, Bld: 84 mg/dL (ref 70–99)
Potassium: 4 mEq/L (ref 3.5–5.1)
SODIUM: 138 meq/L (ref 135–145)

## 2013-08-18 LAB — CBC WITH DIFFERENTIAL/PLATELET
BASOS ABS: 0 10*3/uL (ref 0.0–0.1)
Basophils Relative: 0.3 % (ref 0.0–3.0)
Eosinophils Absolute: 0.2 10*3/uL (ref 0.0–0.7)
Eosinophils Relative: 1.7 % (ref 0.0–5.0)
HCT: 44.3 % (ref 36.0–46.0)
HEMOGLOBIN: 15 g/dL (ref 12.0–15.0)
LYMPHS PCT: 23.3 % (ref 12.0–46.0)
Lymphs Abs: 2.3 10*3/uL (ref 0.7–4.0)
MCHC: 33.9 g/dL (ref 30.0–36.0)
MCV: 91.5 fl (ref 78.0–100.0)
MONOS PCT: 9 % (ref 3.0–12.0)
Monocytes Absolute: 0.9 10*3/uL (ref 0.1–1.0)
Neutro Abs: 6.5 10*3/uL (ref 1.4–7.7)
Neutrophils Relative %: 65.7 % (ref 43.0–77.0)
PLATELETS: 334 10*3/uL (ref 150.0–400.0)
RBC: 4.84 Mil/uL (ref 3.87–5.11)
RDW: 12.3 % (ref 11.5–14.6)
WBC: 9.9 10*3/uL (ref 4.5–10.5)

## 2013-08-18 LAB — HEPATIC FUNCTION PANEL
ALK PHOS: 55 U/L (ref 39–117)
ALT: 13 U/L (ref 0–35)
AST: 19 U/L (ref 0–37)
Albumin: 4.3 g/dL (ref 3.5–5.2)
Bilirubin, Direct: 0.1 mg/dL (ref 0.0–0.3)
Total Bilirubin: 0.6 mg/dL (ref 0.3–1.2)
Total Protein: 7.6 g/dL (ref 6.0–8.3)

## 2013-08-18 LAB — TSH: TSH: 2.52 u[IU]/mL (ref 0.35–5.50)

## 2013-08-18 MED ORDER — IOHEXOL 350 MG/ML SOLN
80.0000 mL | Freq: Once | INTRAVENOUS | Status: AC | PRN
  Administered 2013-08-18: 80 mL via INTRAVENOUS

## 2013-08-18 MED ORDER — LORATADINE 10 MG PO TABS: 10.0000 mg | ORAL_TABLET | Freq: Every day | ORAL | Status: DC | PRN

## 2013-08-18 MED ORDER — FLUTICASONE PROPIONATE 50 MCG/ACT NA SUSP: 2.0000 | Freq: Every day | NASAL | Status: DC

## 2013-08-18 NOTE — Patient Instructions (Addendum)
It was good to see you today.  We have reviewed your prior records including labs and tests today  Test(s) ordered today. Your results will be released to [place] (or called to you) after review, usually within 72hours after test completion. If any changes need to be made, you will be notified at that same time.  Medications reviewed and updated - Use Flonase once daily for next 30 days to help with allergy symptoms and decrease episodes of dizziness Also encouraged use of Claritin or Zyrtec once daily during allergy season no prescription changes recommended at this time.  we'll make referral for CT scan to look for any possible "blood clot" . Our office will contact you regarding appointment(s) once made.  Your ears have been irrigated of wax today -let us know if continued hearing problems persist for referral to audiologist and hearing testing  Please schedule followup in 3-4 months for review, call sooner if problems.

## 2013-08-18 NOTE — Assessment & Plan Note (Signed)
Patient reports history of protein S deficiency, but denies prior DVT or PE Never on anticoagulation for same Family history reviewed, check CT angiogram now r/o PE given dyspnea on exertion

## 2013-08-18 NOTE — Progress Notes (Signed)
Pre visit review using our clinic review tool, if applicable. No additional management support is needed unless otherwise documented below in the visit note. 

## 2013-08-18 NOTE — Progress Notes (Signed)
Subjective:    Patient ID: Veronica Chen, female    DOB: 1965-08-09, 48 y.o.   MRN: [id number]  Shortness of Breath This is a recurrent problem. The current episode started more than 1 month ago. The problem occurs intermittently. The problem has been waxing and waning. Pertinent negatives include no chest pain, claudication, ear pain, fever, headaches, hemoptysis, leg pain, orthopnea, rash, rhinorrhea, sore throat, sputum production, syncope, vomiting or wheezing. Nothing aggravates the symptoms. Risk factors: Prot S defic. She has tried rest for the symptoms. The treatment provided moderate relief. There is no history of asthma, CAD, chronic lung disease, COPD, a heart failure, PE or a recent surgery.    Also reviewed chronic medical issues and interval medical events  Past Medical History  Diagnosis Date  . 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 ECZEMA   . THYROID NODULE   . Palpitations   . PVC's (premature ventricular contractions)   . Vertigo   . Protein S deficiency     Review of Systems  Constitutional: Negative for fever.  HENT: Positive for postnasal drip (seasonal) and tinnitus (bilateral x 3 wks, intermittent, improved with sudafed). Negative for ear pain, rhinorrhea, sore throat, trouble swallowing and voice change.   Respiratory: Positive for chest tightness (with DOE episodes) and shortness of breath (episodic, none in past 3 weeks). Negative for cough, hemoptysis, sputum production and wheezing.   Cardiovascular: Positive for palpitations (with DOE episodes). Negative for chest pain, orthopnea, claudication and syncope.  Gastrointestinal: Negative for vomiting.  Skin: Negative for rash.  Neurological: Positive for dizziness (intermittent, not related to DOE spells, improved with sudafed), light-headedness (with DOE events, no other presyncope with exertion) and numbness (tingling B hands with DOE episodes). Negative for  tremors, seizures, syncope, speech difficulty, weakness and headaches.       Objective:   Physical Exam  BP 112/82  Pulse 84  Temp(Src) 98.8 F (37.1 C) (Oral)  Wt 133 lb 6.4 oz (60.51 kg)  SpO2 98% Wt Readings from Last 3 Encounters:  08/18/13 133 lb 6.4 oz (60.51 kg)  02/04/13 128 lb (58.06 kg)  03/19/12 125 lb (56.7 kg)   Constitutional: She appears well-developed and well-nourished. No distress.  HENT: NCAT, TMs obscured by cerumen L>R, after irrigation, TMs clear without erythema or effusion Neck: Normal range of motion. Neck supple. No JVD present. No thyromegaly present.  Cardiovascular: Normal rate, regular rhythm and normal heart sounds.  No murmur heard. No BLE edema. Pulmonary/Chest: Effort normal and breath sounds normal. No respiratory distress. She has no wheezes.  Neurologic: awake, alert, oriented x4. Cranial nerves II through XII symmetrically intact. No nystagmus. Gait, speech, recall and coordination are normal. Psychiatric: She has a mildly anxious mood and affect. Her behavior is normal. Judgment and thought content normal.   No results found for this basename: WBC, HGB, HCT, PLT, GLUCOSE, CHOL, TRIG, HDL, LDLDIRECT, LDLCALC, ALT, AST, NA, K, CL, CREATININE, BUN, CO2, TSH, PSA, INR, GLUF, HGBA1C, MICROALBUR    Mr Foot Left Wo Contrast  01/21/2013   CLINICAL DATA:  Medial plantar forefoot pain with limited weight-bearing. Sesamoid bone fracture 2 months ago.  EXAM: MRI OF THE LEFT FOREFOOT WITHOUT CONTRAST  TECHNIQUE: Multiplanar, multisequence MR imaging was performed. No intravenous contrast was administered.  COMPARISON:  Outside foot radiographs 01/14/2013.  FINDINGS: There are mild degenerative changes at the 1st metatarsal phalangeal joint with osteophytes and mild subchondral cyst formation  laterally in the metatarsal head. There is a small associated joint effusion. The tibial sesamoid demonstrates marrow edema and fragmentation. 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 Nonunion of a sesamoid fracture is a possibility. The fibular sesamoid appears normal.  The additional metatarsals and toes appear normal. The alignment is normal at the Lisfranc joint.  No web space abnormalities are identified. The flexor and extensor tendons appear normal.  IMPRESSION: 1. Abnormal appearance of the tibial sesamoid of the 1st metatarsal with fragmentation and marrow edema. 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 Findings could be due to nonunion of a fracture. 2. Mild 1st metatarsal phalangeal joint degenerative changes. 3. No other significant findings identified.   Electronically Signed   By: Camie Patience   On: 01/21/2013 11:41       Assessment & Plan:   Intermittent dyspnea - none at this time 3 episodes in past 12 months - associated with prolonged exertion (climbing stairs) and emotional situations (daughter visiting college campuses and selling house) patient with concern for potential asthma Given history of personal hypercoagulable disorder, will check CT angiogram rule out PE Check baseline labs to exclude metabolic abnormality or anemia If labs and CT angio negative, we'll plan for PFTs to evaluate potential for asthma and echo to look for ?cardiac structural abnormality - At this time, episodes too infrequent to see value in an event monitor, but will plan cardiology evaluation if abnormal echo No treatment changes specifically recommended for dyspnea at this time   Cerumen impaction, irrigation as stated above. May help improve her tinnitus. Patient started to call for referral to ENT/audiologist if tinnitus or episodes of vertigo unimproved  Allergic rhinitis. Suspect underlying eustachian tube dysfunction contributing to episodes of vertigo. Neuro exam benign. Patient instructed to begin nasal  steroid and oral antihistamine. Patient agrees to call if vertigo symptoms or tinnitus unimproved/worse

## 2013-08-18 NOTE — Progress Notes (Signed)
Veronica Chen [id number] 08/18/2013  Chief Complaint  Patient presents with  . Shortness of Breath    Subjective  Patient presents w/ intermittent SOB. She noticed it when she was walking up the stairs at [facility] and at the [place] a few months ago on a college visit with her daughter. She also noticed it when she had allergies 2 weeks ago when she was also wheezing and congested. Took Mucinex-DM two weeks ago for her congestion, and thinks this may have contributed. SOB resolved at rest. Feels tachycardic sometimes with her SOB, and she feels like she can't take a deep breath. Has a FH of asthma. Denies cp, palpitations, wheezing, hx of allergies, hx of anxiety, hx of smoking and hx of asthma.  Also presents with BL ear fullness. Has a hx of cerumen impaction and has had ear irrigation in the past. She has constant tinnitus in her ears. Has tried sudafed which improves sxs.   Patient also has intermittent vertigo. Sometimes vomits and gets nauseous. During episodes of vertigo, she has blurry vision and a spinning sensation. She says that when she bends over at church to pray or bends over to text, her vertigo is aggravated. Sudafed helps w/ vertigo sometimes.   HPI      Past Medical History  Diagnosis Date  . 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 ECZEMA   . THYROID NODULE   . Palpitations   . PVC's (premature ventricular contractions)   . Vertigo   . Protein S deficiency     Past Surgical History  Procedure Laterality Date  . Uterine ablation    . Cesarean section      Family History  Problem Relation Age of Onset  . Heart attack      History  Substance Use Topics  . Smoking status: Never Smoker   . 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 Alcohol Use: No    No current outpatient prescriptions on file prior to visit.   No current  facility-administered medications on file prior to visit.     Allergies:No Known Allergies  Review of Systems  Constitutional: Negative for fever and chills.  HENT: Positive for tinnitus. Negative for congestion, ear pain, hearing loss and sore throat.   Eyes: Negative for blurred vision and photophobia.  Respiratory: Negative for cough, shortness of breath and wheezing.   Cardiovascular: Negative for chest pain and palpitations.  Gastrointestinal: Negative for nausea, vomiting and abdominal pain.       No bowel changes.  Genitourinary:       No urinary changes.  Endo/Heme/Allergies: Negative for environmental allergies.  Psychiatric/Behavioral: The patient is not nervous/anxious.        Objective  Filed Vitals:   08/18/13 1011  BP: 112/82  Pulse: 84  Temp: 98.8 F (37.1 C)  TempSrc: Oral  Weight: 133 lb 6.4 oz (60.51 kg)  SpO2: 98%    Physical Exam  Constitutional: She is oriented to person, place, and time. She appears well-developed and well-nourished. No distress.  HENT:  Mouth/Throat: Oropharynx is clear and moist. No oropharyngeal exudate.  H: No tenderness on palpation of sinuses; E: L: Cerumen impacted canal. No visible TM; R: Hazy TM w/ visble bony structures. No erythema or bulging N: Erythematous nasal turbinates bilaterally. T: No erythema, edema or exudates.   Eyes:  Pupils are equal, round, and reactive to light. Right eye exhibits no discharge. No scleral icterus.  Neck: Neck supple. No thyromegaly present.  Cardiovascular: Normal rate, regular rhythm and normal heart sounds.  Exam reveals no gallop and no friction rub.   No murmur heard. Respiratory: Effort normal and breath sounds normal. No respiratory distress. She has no wheezes. She has no rales.  Lymphadenopathy:    She has no cervical adenopathy.  Neurological: She is alert and oriented to person, place, and time.  Psychiatric: She has a normal mood and affect. Her behavior is normal. Judgment and  thought content normal.    BP Readings from Last 3 Encounters:  08/18/13 112/82  02/04/13 110/66  03/19/12 118/84    Wt Readings from Last 3 Encounters:  08/18/13 133 lb 6.4 oz (60.51 kg)  02/04/13 128 lb (58.06 kg)  03/19/12 125 lb (56.7 kg)    No results found for this basename: WBC, HGB, HCT, PLT, GLUCOSE, CHOL, TRIG, HDL, LDLDIRECT, LDLCALC, ALT, AST, NA, K, CL, CREATININE, BUN, CO2, TSH, PSA, INR, GLUF, HGBA1C, MICROALBUR    Mr Foot Left Wo Contrast  01/21/2013   CLINICAL DATA:  Medial plantar forefoot pain with limited weight-bearing. Sesamoid bone fracture 2 months ago.  EXAM: MRI OF THE LEFT FOREFOOT WITHOUT CONTRAST  TECHNIQUE: Multiplanar, multisequence MR imaging was performed. No intravenous contrast was administered.  COMPARISON:  Outside foot radiographs 01/14/2013.  FINDINGS: There are mild degenerative changes at the 1st metatarsal phalangeal joint with osteophytes and mild subchondral cyst formation laterally in the metatarsal head. There is a small associated joint effusion. The tibial sesamoid demonstrates marrow edema and fragmentation. 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 Nonunion of a sesamoid fracture is a possibility. The fibular sesamoid appears normal.  The additional metatarsals and toes appear normal. The alignment is normal at the Lisfranc joint.  No web space abnormalities are identified. The flexor and extensor tendons appear normal.  IMPRESSION: 1. Abnormal appearance of the tibial sesamoid of the 1st metatarsal with fragmentation and marrow edema. 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 Findings could be due to nonunion of a fracture. 2. Mild 1st metatarsal phalangeal joint degenerative changes. 3. No other significant findings identified.   Electronically Signed   By: Camie Patience   On: 01/21/2013 11:41        Assessment and Plan  SOB: Intermittent SOB that started a few months ago that occurs on exertion and is resolved at rest. Due to [place] of venous thromboembolism, ordered a CTA to r/o PE. If CTA negative, told patient EKG would be ordered to assess for arrythmia. If cardiac origin suspected on EKG, an ECHO will be ordered to make sure no structural or valvular abnormalities responsible for SOB. Ordered PFTs to make sure SOB is not related to asthma, given FH of asthma. SOB may be anxiety-related due to current life stressors.  Cerumen impaction in left ear: Irrigated ear to remove wax.  Eustachian Tube Dysfunction: Prescribed Flonase to help clear eustachian tubes since ear fullness may be allergy related and patient presents with sx of vertigo. If vertigo does not resolve after tx w/ Flonase, may need to schedule hearing test to make sure this is not early stage of hearing loss.   Labs ordered: BMP, TSH, CBC w/ Differential, Hepatic Function Panel to r/o metabolic origin of SOB.   No  Follow-up on file. Berenice Bouton, Student-PA    I have personally reviewed this case with PA student. I also personally examined this patient. I agree with history and findings as documented above. I reviewed, discussed and approve of the assessment and plan as listed above. Rowe Clack, MD

## 2013-08-18 NOTE — Addendum Note (Signed)
Addended by: Gwendolyn Grant A on: 08/18/2013 04:14 PM   Modules accepted: Orders

## 2013-12-18 ENCOUNTER — Other Ambulatory Visit: Payer: 59

## 2013-12-18 ENCOUNTER — Ambulatory Visit (INDEPENDENT_AMBULATORY_CARE_PROVIDER_SITE_OTHER): Payer: 59 | Admitting: Nurse Practitioner

## 2013-12-18 ENCOUNTER — Encounter: Payer: Self-pay | Admitting: Nurse Practitioner

## 2013-12-18 VITALS — BP 118/78 | HR 84 | Temp 97.1°F | Resp 18 | Ht 60.0 in | Wt 129.2 lb

## 2013-12-18 DIAGNOSIS — R3915 Urgency of urination: Secondary | ICD-10-CM

## 2013-12-18 DIAGNOSIS — N342 Other urethritis: Secondary | ICD-10-CM

## 2013-12-18 DIAGNOSIS — R300 Dysuria: Secondary | ICD-10-CM

## 2013-12-18 LAB — POCT URINALYSIS DIPSTICK
Bilirubin, UA: NEGATIVE
Blood, UA: NEGATIVE
GLUCOSE UA: NEGATIVE
KETONES UA: NEGATIVE
Leukocytes, UA: NEGATIVE
NITRITE UA: NEGATIVE
PH UA: 6.5
Protein, UA: NEGATIVE
Spec Grav, UA: 1.02
Urobilinogen, UA: 4

## 2013-12-18 MED ORDER — CEFUROXIME AXETIL 500 MG PO TABS
500.0000 mg | ORAL_TABLET | Freq: Two times a day (BID) | ORAL | Status: DC
Start: 2013-12-18 — End: 2014-01-15

## 2013-12-18 NOTE — Patient Instructions (Signed)
Influenza vaccine given today.  Apply warm compresses to injection site if becomes painful. Take antibiotic as prescribed until complete.  Call clinic if symptoms not resolved or worsen Follow up with Primary [facility] as scheduled       Asymptomatic Bacteriuria Asymptomatic bacteriuria is the presence of a large number of bacteria in your urine without the usual symptoms of burning or frequent urination. The following conditions increase the risk of asymptomatic bacteriuria:  Diabetes mellitus.  Advanced age.  Pregnancy in the first trimester.  Kidney stones.  Kidney transplants.  Leaky kidney tube valve in young children (reflux). Treatment for this condition is not needed in most people and can lead to other problems such as too much yeast and growth of resistant bacteria. However, some people, such as pregnant women, do need treatment to prevent kidney infection. Asymptomatic bacteriuria in pregnancy is also associated with fetal growth restriction, premature labor, and newborn death. HOME CARE INSTRUCTIONS Monitor your condition for any changes. The following actions may help to relieve any discomfort you are feeling:  Drink enough water and fluids to keep your urine clear or pale yellow. Go to the bathroom more often to keep your bladder empty.  Keep the area around your vagina and rectum clean. Wipe yourself from front to back after urinating. SEEK IMMEDIATE MEDICAL CARE IF:  You develop signs of an infection such as:  Burning with urination.  Frequency of voiding.  Back pain.  Fever.  You have blood in the urine.  You develop a fever. MAKE SURE YOU:  Understand these instructions.  Will watch your condition.  Will get help right away if you are not doing well or get worse. Document Released: 04/10/2005 Document Revised: 08/25/2013 Document Reviewed: 09/30/2012 [facility] Patient Information 2015 [place], [place]. This information is not intended to  replace advice given to you by your health care provider. Make sure you discuss any questions you have with your health care provider.

## 2013-12-18 NOTE — Progress Notes (Signed)
Pre visit review using our clinic review tool, if applicable. No additional management support is needed unless otherwise documented below in the visit note. 

## 2013-12-18 NOTE — Progress Notes (Signed)
Subjective:    Patient ID: Veronica Chen, female    DOB: May 18, 1965, 48 y.o.   MRN: [id number]  HPI  Patient is seen for complaints of ongoing dyuria, urgency, frequency for past several days.  Patient reports she gets symptoms following sexual intercourse, and holding urine to long.  Had a "bad" UTI in May but did not take all of the antibiotic, Amoxicillin.  Denies pregnancy.  Menses irregular as reported by patient due to peri menopausal and congenital abnormal clotting factors.  She was seen by GYN recently discussed Hysterectomy not an option at this time.               Past Medical History  Diagnosis Date  . 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 ECZEMA   . THYROID NODULE   . Palpitations   . PVC's (premature ventricular contractions)   . Vertigo   . Protein S deficiency     History   Social History  . Marital Status: Married    Spouse Name: N/A    Number of Children: N/A  . Years of Education: N/A   Occupational History  . Not on file.   Social History Main Topics  . Smoking status: Never Smoker   . 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 Alcohol Use: No  . Drug Use: No  . Sexual Activity: Not on file   Other Topics Concern  . Not on file   Social History Narrative  . No narrative on file    Past Surgical History  Procedure Laterality Date  . Uterine ablation    . Cesarean section      Family History  Problem Relation Age of Onset  . Heart attack      No Known Allergies  Current Outpatient Prescriptions on File Prior to Visit  Medication Sig Dispense Refill  . fluticasone (FLONASE) 50 MCG/ACT nasal spray Place 2 sprays into both nostrils daily.  16 g  6  . loratadine (CLARITIN) 10 MG tablet Take 1 tablet (10 mg total) by mouth daily as needed for allergies.  30 tablet  2   No current facility-administered medications  on file prior to visit.    BP 118/78  Pulse 84  Temp(Src) 97.1 F (36.2 C) (Oral)  Resp 18  Ht 5' (1.524 m)  Wt 129 lb 3.2 oz (58.605 kg)  BMI 25.23 kg/m2  SpO2 95%       Review of Systems  Constitutional: Negative.   Respiratory: Negative.  Negative for cough, shortness of breath and wheezing.   Cardiovascular: Negative.  Negative for chest pain and palpitations.  Gastrointestinal: Negative for nausea, vomiting and abdominal pain.  Genitourinary: Positive for dysuria, urgency, frequency and menstrual problem. Negative for flank pain, decreased urine volume and pelvic pain.  Musculoskeletal: Negative for back pain.  Skin: Negative.   Psychiatric/Behavioral: Negative.        Objective:   Physical Exam  Constitutional: She is oriented to person, place, and time. She appears well-developed and well-nourished. No distress.  HENT:  Head: Normocephalic.  Neck: Normal range of motion. Neck supple.  Cardiovascular: Normal rate, regular rhythm and normal heart sounds.   Pulmonary/Chest: Effort normal and breath sounds normal. No respiratory distress. She has no wheezes. She has no rales.  Abdominal: Soft. Bowel sounds are normal. She exhibits no distension  and no mass. There is no tenderness. There is no guarding.  Describes  Pressure sense of need to urinate when palpated over baldder  Neurological: She is alert and oriented to person, place, and time.  Skin: Skin is warm and dry.  Psychiatric: She has a normal mood and affect.          Assessment & Plan:  1. Dysuria 2. Urgency of urination Urinalysis negative, sent for culture - POCT urinalysis dipstick - Urine culture; Future - cefUROXime (CEFTIN) 500 MG tablet; Take 1 tablet (500 mg total) by mouth 2 (two) times daily with a meal.  Dispense: 10 tablet; Refill: 1 - Urine culture Discussed possible urethritis. interstitial cystitis/ chronic UTI possible realted to anatomical / low estrogen  Discussed with patient to  take antibitoitc Ceftin 500mg  for 5 days.  May take refill 1 po ceftin after sexual intercourse trial.  Adequate fluid intake. Cranberry juice,  Followk up with  Primary Care Provider as indicated.

## 2013-12-20 LAB — URINE CULTURE
Colony Count: NO GROWTH
Organism ID, Bacteria: NO GROWTH

## 2014-01-15 ENCOUNTER — Encounter: Payer: Self-pay | Admitting: Internal Medicine

## 2014-01-15 ENCOUNTER — Ambulatory Visit (INDEPENDENT_AMBULATORY_CARE_PROVIDER_SITE_OTHER): Payer: 59 | Admitting: Internal Medicine

## 2014-01-15 VITALS — BP 128/86 | HR 79 | Temp 98.2°F | Resp 14 | Wt 130.1 lb

## 2014-01-15 DIAGNOSIS — H9313 Tinnitus, bilateral: Secondary | ICD-10-CM

## 2014-01-15 DIAGNOSIS — R42 Dizziness and giddiness: Secondary | ICD-10-CM

## 2014-01-15 DIAGNOSIS — H9390 Unspecified disorder of ear, unspecified ear: Secondary | ICD-10-CM

## 2014-01-15 DIAGNOSIS — H9392 Unspecified disorder of left ear: Secondary | ICD-10-CM

## 2014-01-15 DIAGNOSIS — H9319 Tinnitus, unspecified ear: Secondary | ICD-10-CM

## 2014-01-15 NOTE — Progress Notes (Signed)
Subjective:    Patient ID: Veronica Chen, female    DOB: 06/24/1965, 48 y.o.   MRN: [id number]  HPI  She presents with recurrent classic vertigo.  The symptoms initially began approximately 8 years ago. She was evaluated by [facility] including a CT scan.  She's had 4 episodes since then. The most recent  Two have occurred within the last 4+ weeks  The original episode was severe ,up to a level X. She states that the last  2 episodes have been up to level V.  When these occur she must hold onto a table or chair for support & stare straight ahead. They're associated with profound diaphoresis and nausea. The symptoms typically last 15 seconds but the after effects linger for 2-3 hours  Episodes will occur if she is reading or praying in church &  Extends her neck.  No benign positional vertigo component  She has no cardiac or neuro prodrome prior to the events.   For the last 2 years she's had intermittent high-pitched tinnitus "like  radio static" in both ears. It can occur in either ear .  She describes pressure in the ears for which she's been using Sudafed. She believes this helps the sensation of congestion such as flying in a plane.  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      Review of Systems   Denied were any change in heart rhythm or rate prior to the event. There was no associated chest pain or shortness of breath .  Also specifically denied prior to the episode were headache, limb weakness, tingling, or numbness. No seizure activity noted.  She denies any constitutional symptoms of fever, chills, sweats, or weight change  She denies diplopia or vision loss  She's had no associated limb numbness, tingling, or weakness       Objective:   Physical Exam   The only abnormality on exam was some lateralization to the left with a tuning fork. She had minimal wax in the ears without cerumen impactions. Cranial  nerve exam and neurologic exam were totally normal to include heel and toe walking as well as Romberg and finger to nose testing. Position change could not elicit any symptoms.  Gen.: Healthy and well-nourished in appearance. Alert, appropriate and cooperative throughout exam. Appears younger than stated age  Head: Normocephalic without obvious abnormalities  Eyes: No corneal or conjunctival inflammation noted. Pupils equal round reactive to light and accommodation. Extraocular motion intact. Field of  Vision grossly normal  Ears: External  ear exam reveals no significant lesions or deformities. Canals clear .TMs normal. Hearing is grossly normal bilaterally.  AC> BC Nose: External nasal exam reveals no deformity or inflammation. Nasal mucosa are pink and moist. No lesions or exudates noted.   Mouth: Oral mucosa and oropharynx reveal no lesions or exudates. Teeth in good repair. Neck: No deformities, masses, or tenderness noted. Range of motion normal but she was hesitant to flex neck . Thyroid normal Lungs: Normal respiratory effort; chest expands symmetrically. Lungs are clear to auscultation without rales, wheezes, or increased work of breathing. Heart: Normal rate and rhythm. Normal S1 and S2. No gallop, click, or rub.No murmur. Abdomen: Bowel sounds normal; abdomen soft and nontender. No masses, organomegaly or hernias noted.                        Musculoskeletal/extremities: No deformity or scoliosis noted of  the  thoracic or lumbar spine.  No clubbing, cyanosis, edema, or significant extremity  deformity noted. Range of motion normal .Tone & strength normal. Hand joints normal  Fingernail health good. Able to lie down & sit up w/o help. Negative SLR bilaterally Vascular: Carotid, radial artery, dorsalis pedis and  posterior tibial pulses are full and equal. No bruits present. Neurologic: Alert and oriented x3. Deep tendon reflexes symmetrical and normal.  Skin: Intact without suspicious  lesions or rashes. Lymph: No cervical, axillary lymphadenopathy present. Psych: Mood and affect are normal. Normally interactive                                                                                       Assessment & Plan:  #1 classic vertigo #2 tinnitus #3 abnormal tuning fork exam See orders & AVS

## 2014-01-15 NOTE — Patient Instructions (Signed)
Go to [facility] for eustachian tube dysfunction. Drink thin  fluids liberally through the day and chew sugarless gum . Do the Valsalva maneuver several times a day to "pop" ears open. Flonase 1 spray in each nostril twice a day as needed. Use the "crossover" technique as discussed.   Please do not use Q-tips as we discussed. Should wax build up occur, please put 2-3 drops of mineral oil in the affected  ear at night to soften the wax .Cover the canal with a  cotton ball to prevent the oil from staining bed linens. In the morning fill the ear canal with hydrogen peroxide & lie in the opposite lateral decubitus position(on the side opposite the affected ear)  for 10-15 minutes. After allowing this period of time for the peroxide to dissolve the wax ;shower and use the thinnest washrag available to wick out the wax. If both ears are involved ; alternate this treatment from ear to ear each night until no wax is found on the washrag.

## 2014-01-15 NOTE — Progress Notes (Signed)
Pre visit review using our clinic review tool, if applicable. No additional management support is needed unless otherwise documented below in the visit note. 

## 2014-01-28 ENCOUNTER — Ambulatory Visit (INDEPENDENT_AMBULATORY_CARE_PROVIDER_SITE_OTHER): Payer: 59 | Admitting: Neurology

## 2014-01-28 ENCOUNTER — Encounter: Payer: Self-pay | Admitting: Neurology

## 2014-01-28 ENCOUNTER — Encounter (INDEPENDENT_AMBULATORY_CARE_PROVIDER_SITE_OTHER): Payer: Self-pay

## 2014-01-28 VITALS — BP 92/60 | HR 86 | Resp 15 | Ht 60.0 in | Wt 132.5 lb

## 2014-01-28 DIAGNOSIS — D6859 Other primary thrombophilia: Secondary | ICD-10-CM

## 2014-01-28 DIAGNOSIS — H5502 Latent nystagmus: Secondary | ICD-10-CM

## 2014-01-28 DIAGNOSIS — H5319 Other subjective visual disturbances: Secondary | ICD-10-CM

## 2014-01-28 DIAGNOSIS — H53143 Visual discomfort, bilateral: Secondary | ICD-10-CM

## 2014-01-28 DIAGNOSIS — H8142 Vertigo of central origin, left ear: Secondary | ICD-10-CM

## 2014-01-28 NOTE — Progress Notes (Signed)
[facility]   Provider:  Larey Seat, M D  Referring Provider: Melida Quitter, MD- ENT  Rowe Clack, MD Primary Care Physician:   Chief Complaint  Patient presents with  . Vertigo, Migraine    NP, internal referral, Rm 11    HPI:  Veronica Chen is a 48 y.o. female, who is seen here upon a referral from Dr. Redmond Baseman for an evaluation of migraines.   Dear Dr. Redmond Baseman,    Thank you for allowing me to participate in your patients care. This pleasant, right handed, married , caucasian female reports a strong family history of migraine in her father and cyclic vomiting in her son. She has been experiencing Vertigo with sudden onset and violent nausea.  The patient experienced the first spell 8 years ago -while sitting in a restaurant at a table and the onset was  without any warning or aura, "suddenly the room started spinning" and she felt unable to move, she had to hold onto the arm chair she sat in felt unsteady to even rise or walk and her husband had to help her and guided her to the bathroom. The vertigo was spinning around her and she felt as if she had just gotten off an amusement park ride. The vertigo was clockwise.The vertigo remained present for a day and night. She went to [facility] close to her home, and her condition was diagnosed as vertigo and was given Meclizine- an MRI of the brain was ordered and read as normal.  Last year she had 2  significant episodes. One time she was shopping, tried on clothes and as she looked up to the right had another sudden vertigo spell, this time she controlled it by focusing visually on one object and this calms her vertigo and abbreviates the spell, she has diaphoresis but less when focusing. She has a slight headache after these spells and her eyelids "become so heavy" and she is "overwhelmingly sleepy".  Her eye movements feel irregular, "jumpy" during a spell. She has been unable to read, the words blurr.   There is photophobia with these spells. There is phonophobia- to deep base sounds. She felt off balance.   Her son has some of these symptoms and her son wears sunglasses, she has adapted to this step , too.  She reports sleeping poorly, 4-6 hours , often with the help of Advil PM.  She is still menstruating , has not noted a cyclic pattern to her headaches.   She patient has a protein S deficiency and is at higher risk of developing  blood clots.     Review of Systems: Out of a complete 14 system review, the patient complains of only the following symptoms, and all other reviewed systems are negative.  Epworth score 10 , Fatigue severity score n/a   , depression score n/a   History   Social History  . Marital Status: Married    Spouse Name: N/A    Number of Children: N/A  . Years of Education: college 2   Occupational History  . [employer]    Social History Main Topics  . Smoking status: Never Smoker   . 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 Alcohol Use: No  . Drug Use: No  . Sexual Activity: Not on file   Other Topics Concern  . Not on file   Social History  Narrative  . No narrative on file    Family History  Problem Relation Age of Onset  . Heart attack      Past Medical History  Diagnosis Date  . 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 ECZEMA   . THYROID NODULE   . Palpitations   . PVC's (premature ventricular contractions)   . Vertigo   . Protein S deficiency     Past Surgical History  Procedure Laterality Date  . Uterine ablation    . Cesarean section      x2    Current Outpatient Prescriptions  Medication Sig Dispense Refill  . doxycycline (VIBRAMYCIN) 100 MG capsule Take 100 mg by mouth daily as needed.       . fluticasone (FLONASE) 50 MCG/ACT nasal spray Place 2 sprays into both nostrils daily.  16 g  6   No  current facility-administered medications for this visit.    Allergies as of 01/28/2014  . (No Known Allergies)    Vitals: BP 92/60  Pulse 86  Resp 15  Ht 5' (1.524 m)  Wt 132 lb 8 oz (60.102 kg)  BMI 25.88 kg/m2 Last Weight:  Wt Readings from Last 1 Encounters:  01/28/14 132 lb 8 oz (60.102 kg)       Last Height:   Ht Readings from Last 1 Encounters:  01/28/14 5' (1.524 m)    Physical exam:  General: The patient is awake, alert and appears not in acute distress. The patient is well groomed. Head: Normocephalic, atraumatic. Neck is supple. Mallampati 2 ,  neck circumference:13. Nasal airflow congested TMJ is  not evident . Retrognathia is notseen.  Cardiovascular:  Regular rate and rhythm , without  murmurs or carotid bruit, and without distended neck veins. Respiratory: Lungs are clear to auscultation. Skin:  Without evidence of edema, or rash Trunk: BMI is  elevated and patient  has normal posture.  Neurologic exam : The patient is awake and alert, oriented to place and time.   Memory subjective  described as intact. There is a normal attention span & concentration ability. Speech is fluent without  dysarthria, dysphonia or aphasia.  Mood and affect are appropriate.  Cranial nerves: Pupils are equal and briskly reactive to light. Funduscopic exam without  evidence of pallor or edema, wearer of contacts.  Near sighted. Extraocular movements  in vertical and horizontal planes intact ,  Nystagmus is seen at endpoint with gaze to the left. Visual fields by finger perimetry are intact. Hearing to finger rub intact. Facial sensation intact to fine touch. Facial motor strength is symmetric and tongue and uvula move midline.  Motor exam:  Normal tone, muscle bulk and symmetric,strength in all extremities.  Sensory:  Fine touch, pinprick and vibration were tested in all extremities.  Proprioception is tested in the upper extremities only. This was  normal.  Coordination: Rapid  alternating movements in the fingers/hands is normal.  Finger-to-nose maneuver normal without evidence of ataxia, dysmetria or tremor.  Gait and station: Patient walks without assistive device and is able unassisted to climb up to the exam table. Strength within normal limits.  Toe and heel walk intact, Stance is stable and normal. Tandem gait is unfragmented. Deep tendon reflexes: in the  upper and lower extremities are symmetric and intact. Babinski maneuver response is downgoing.   Assessment:  After physical and neurologic examination, review of laboratory studies, imaging, neurophysiology testing and pre-existing records, assessment  is   1) patient with vertigo, nausea and diaphoresis, followed by headaches. Photophobia and nausea. Dysequilibrium. 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    The patient was advised of the nature of the diagnosed sleep disorder , the treatment options and risks for general a health and wellness arising from not treating the condition. Visit duration was 45 minutes.   Plan:  Treatment plan and additional workup :  TCD dynamic study for vertebrobasilar insufficiency work up, MRI, and MRV ( protein s deficiency)  Vestibular rehab if negative.      Asencion Partridge Martena Emanuele MD  01/28/2014

## 2014-01-28 NOTE — Patient Instructions (Signed)
Bickerstaff's Syndrome (Basilar Migraine) CAUSES  When migraine affects the circulation in back of the brain or neck, it can cause basilar migraine or Bickerstaff's syndrome.  SYMPTOMS  It occurs most frequently in young women. Symptoms include:  Dizziness.  Double vision.  Loss of balance.  Confusion.  Slurred speech.  Fainting.  Disorientation.  During the severe (acute) headache, some people lose consciousness. Often these patients are mistakenly thought to be intoxicated, under the influence of drugs, or suffering from other conditions. A previous history of migraine is helpful in making the diagnosis.  TREATMENT  Basilar migraines are treated medically the same as all other migraines. HOME CARE INSTRUCTIONS   If this is your first diagnosed migraine headache, you may simply choose to wait and watch. You can wait to see if you have another headache before deciding on a further treatment.  You may consult your caregiver or do as suggested by the current treating caregiver.  Numerous medications can prevent these headaches if they are recurrent or should they become recurrent. Your caregiver can help you with a medication or treatment program that will be helpful to you.  If this has been a chronic (long-standing) condition, using continuous narcotics is not recommended. Using long-term narcotics can cause recurrent migraines. Narcotics are a temporary measure only. They are used for the infrequent migraine that fails to respond to all other measures. SEEK IMMEDIATE MEDICAL CARE IF:   You do not get relief from the medications given to you or you have a recurrence of pain.  You have an unexplained oral temperature above 102 F (38.9 C), or as your caregiver suggests.  You have a stiff neck.  You have loss of vision.  You have muscular weakness.  You have loss of muscular control.  You develop severe symptoms different from your first symptoms.  You start losing  your balance or have trouble walking.  You feel faint or pass out. MAKE SURE YOU:   Understand these instructions.  Will watch your condition.  Will get help right away if you are not doing well or get worse. Document Released: 04/10/2005 Document Revised: 07/03/2011 Document Reviewed: 11/27/2007 [facility] Patient Information 2015 [place], [place]. This information is not intended to replace advice given to you by your health care provider. Make sure you discuss any questions you have with your health care provider. Vertigo Vertigo means you feel like you are moving when you are not. Vertigo can make you feel like things around you are moving when they are not. This problem often goes away on its own.  HOME CARE   Follow your doctor's instructions.  Avoid driving.  Avoid using heavy machinery.  Avoid doing any activity that could be dangerous if you have a vertigo attack.  Tell your doctor if a medicine seems to cause your vertigo. GET HELP RIGHT AWAY IF:   Your medicines do not help or make you feel worse.  You have trouble talking or walking.  You feel weak or have trouble using your arms, hands, or legs.  You have bad headaches.  You keep feeling sick to your stomach (nauseous) or throwing up (vomiting).  Your vision changes.  A family member notices changes in your behavior.  Your problems get worse. MAKE SURE YOU:  Understand these instructions.  Will watch your condition.  Will get help right away if you are not doing well or get worse. Document Released: 01/18/2008 Document Revised: 07/03/2011 Document Reviewed: 10/27/2010 [facility] Patient Information 2015 [place], [place]. This  information is not intended to replace advice given to you by your health care provider. Make sure you discuss any questions you have with your health care provider.  

## 2014-01-29 ENCOUNTER — Telehealth: Payer: Self-pay | Admitting: Neurology

## 2014-01-29 NOTE — Telephone Encounter (Signed)
Veronica Chen,  I got these  Peer to peer and repeal requests and printed them out to have the phone number ready-  can you mark them urgent when you send them,please? Thank You. CD

## 2014-01-30 NOTE — Telephone Encounter (Signed)
Study permitted as MRA and MRV ,  CC [id number] non contrast . Reviewer Peer  Dr Tommi Rumps, 01-30-14 . 16.43 hours.

## 2014-02-04 ENCOUNTER — Ambulatory Visit (INDEPENDENT_AMBULATORY_CARE_PROVIDER_SITE_OTHER): Payer: 59

## 2014-02-04 ENCOUNTER — Encounter (INDEPENDENT_AMBULATORY_CARE_PROVIDER_SITE_OTHER): Payer: Self-pay

## 2014-02-04 DIAGNOSIS — H8142 Vertigo of central origin, left ear: Secondary | ICD-10-CM

## 2014-02-04 DIAGNOSIS — H53143 Visual discomfort, bilateral: Secondary | ICD-10-CM

## 2014-02-04 DIAGNOSIS — D6859 Other primary thrombophilia: Secondary | ICD-10-CM

## 2014-02-04 DIAGNOSIS — H5319 Other subjective visual disturbances: Secondary | ICD-10-CM

## 2014-02-04 DIAGNOSIS — H5502 Latent nystagmus: Secondary | ICD-10-CM

## 2014-02-04 MED ORDER — GADOPENTETATE DIMEGLUMINE 469.01 MG/ML IV SOLN: 13.0000 mL | Freq: Once | INTRAVENOUS | Status: AC | PRN

## 2014-02-12 ENCOUNTER — Other Ambulatory Visit: Payer: Self-pay | Admitting: Neurology

## 2014-02-12 ENCOUNTER — Ambulatory Visit: Payer: 59

## 2014-02-12 ENCOUNTER — Other Ambulatory Visit: Payer: 59

## 2014-02-12 ENCOUNTER — Ambulatory Visit (INDEPENDENT_AMBULATORY_CARE_PROVIDER_SITE_OTHER): Payer: 59

## 2014-02-12 DIAGNOSIS — H8142 Vertigo of central origin, left ear: Secondary | ICD-10-CM

## 2014-02-17 NOTE — Progress Notes (Signed)
Quick Note:  LMVM for pt that MRI is normal. ______

## 2014-02-18 ENCOUNTER — Ambulatory Visit (INDEPENDENT_AMBULATORY_CARE_PROVIDER_SITE_OTHER): Payer: 59 | Admitting: Adult Health

## 2014-02-18 ENCOUNTER — Encounter: Payer: Self-pay | Admitting: Adult Health

## 2014-02-18 VITALS — BP 110/72 | Ht 60.0 in | Wt 132.4 lb

## 2014-02-18 DIAGNOSIS — R42 Dizziness and giddiness: Secondary | ICD-10-CM

## 2014-02-18 NOTE — Progress Notes (Signed)
PATIENT: Veronica Chen DOB: 1965/12/28  REASON FOR VISIT: follow up HISTORY FROM: patient  HISTORY OF PRESENT ILLNESS: Veronica Chen is a 48 year old female with a history of vertigo. She returns today for follow-up. She had an MRI/MRV that was normal. TCD showed elevated cerebral blood flow velocities in the majority of arteries to the anterior and posterior circulations as well as asymmetrically abnormal elevated cerebral blood flow velocities in the right carotid siphon.  Patient reports that she has not had an additional episodes since she started aspirin. She states that she did research on Vertebrobasilar insufficiency and she believes she has all those symptoms but mildly. She started the aspirin on her own and reports that she does feel better taking the aspirin. She states that could just be a placebo effect. She states that the vertigo does not happen often but it can sometimes occur after a brisk movement of the head or when she is sitting still. She reports that she sometimes will get a headache after the vertigo but that does not always occur.   HISTORY 01/28/14 (CD): Thank you for allowing me to participate in your patients care. This pleasant, right handed, married , caucasian female reports a strong family history of migraine in her father and cyclic vomiting in her son. She has been experiencing Vertigo with sudden onset and violent nausea. The patient experienced the first spell 8 years ago -while sitting in a restaurant at a table and the onset was without any warning or aura, "suddenly the room started spinning" and she felt unable to move, she had to hold onto the arm chair she sat in felt unsteady to even rise or walk and her husband had to help her and guided her to the bathroom. The vertigo was spinning around her and she felt as if she had just gotten off an amusement park ride. The vertigo was clockwise.The vertigo remained present for a day and night. She went to [facility] close to her home, and her condition was diagnosed as vertigo and was given Meclizine- an MRI of the brain was ordered and read as normal. Last year she had 2 significant episodes. One time she was shopping, tried on clothes and as she looked up to the right had another sudden vertigo spell, this time she controlled it by focusing visually on one object and this calms her vertigo and abbreviates the spell, she has diaphoresis but less when focusing. She has a slight headache after these spells and her eyelids "become so heavy" and she is "overwhelmingly sleepy". Her eye movements feel irregular, "jumpy" during a spell. She has been unable to read, the words blurr. There is photophobia with these spells. There is phonophobia- to deep base sounds. She felt off balance. Her son has some of these symptoms and her son wears sunglasses, she has adapted to this step , too. she reports sleeping poorly, 4-6 hours , often with the help of Advil PM. She is still menstruating , has not noted a cyclic pattern to her headaches. She patient has a protein S deficiency and is at higher risk of developing blood clots.  REVIEW OF SYSTEMS: Full 14 system review of systems performed and notable only for:  Constitutional: N/A  Eyes: Light sensitivity Ear/Nose/Throat: Ringing in ears Skin: N/A  Cardiovascular: N/A  Respiratory: N/A  Gastrointestinal: N/A  Genitourinary: N/A Hematology/Lymphatic: N/A  Endocrine: N/A Musculoskeletal:N/A  Allergy/Immunology: N/A  Neurological: Dizziness  Psychiatric: N/A Sleep: Frequent waking  ALLERGIES: No Known Allergies  HOME MEDICATIONS: Outpatient Prescriptions Prior to Visit  Medication Sig Dispense Refill  . doxycycline (VIBRAMYCIN) 100 MG capsule Take 100 mg by mouth daily as needed.       . fluticasone (FLONASE) 50 MCG/ACT nasal spray Place 2 sprays into both nostrils daily.  16 g  6   No facility-administered medications prior to visit.    PAST  MEDICAL HISTORY: Past Medical History  Diagnosis Date  . 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 ECZEMA   . THYROID NODULE   . Palpitations   . PVC's (premature ventricular contractions)   . Vertigo   . Protein S deficiency     PAST SURGICAL HISTORY: Past Surgical History  Procedure Laterality Date  . Uterine ablation    . Cesarean section      x2    FAMILY HISTORY: Family History  Problem Relation Age of Onset  . Heart attack      SOCIAL HISTORY: History   Social History  . Marital Status: Married    Spouse Name: N/A    Number of Children: 2  . Years of Education: college 2   Occupational History  . [employer]    Social History Main Topics  . Smoking status: Never Smoker   . 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 Alcohol Use: No  . Drug Use: No  . Sexual Activity: Not on file   Other Topics Concern  . Not on file   Social History Narrative   Patient lives at home with husband Veronica Chen    Patient has 2 children.    Patient has a college education.    Patient is right handed.       PHYSICAL EXAM  Filed Vitals:   02/18/14 0930  BP: 110/72  Height: 5' (1.524 m)  Weight: 132 lb 6.4 oz (60.056 kg)   Body mass index is 25.86 kg/(m^2).  Generalized: Well developed, in no acute distress   Neurological examination  Mentation: Alert oriented to time, place, history taking. Follows all commands speech and language fluent Cranial nerve II-XII: Pupils were equal round reactive to light. Extraocular movements were full, visual field were full on confrontational test. Facial sensation and strength were normal. Uvula tongue midline. Head turning and shoulder shrug  were normal and symmetric. Motor: The motor testing reveals 5 over 5 strength of all 4 extremities. Good symmetric motor tone is noted throughout.  Sensory: Sensory testing is  intact to soft touch on all 4 extremities. No evidence of extinction is noted.  Coordination: Cerebellar testing reveals good finger-nose-finger and heel-to-shin bilaterally.  Gait and station: Gait is normal. Tandem gait is normal. Romberg is negative. No drift is seen.  Reflexes: Deep tendon reflexes are symmetric and normal bilaterally.    DIAGNOSTIC DATA (LABS, IMAGING, TESTING) - I reviewed patient records, labs, notes, testing and imaging myself where available.  Transcranial Doppler study 02/12/2014: TCD study is consistent with abnormally elevated cerebral blood flow velocities in the majority of arteries to the anterior and posterior circulations as well as asymmetrically abnormal elevated cervical blood flow velocities in the right carotid siphon-C4 and abnormally elevated cerebral  Blood flow velocity in the left carotid siphon- C3. The cause of this finding could be the TCD signs of mild multiple focal nearing the cerebral vessels versus effective systemic  factors like anemia.  Lab Results  Component Value Date   WBC 9.9 08/18/2013   HGB 15.0 08/18/2013   HCT 44.3 08/18/2013   MCV 91.5 08/18/2013   PLT 334.0 08/18/2013      Component Value Date/Time   NA 138 08/18/2013 1110   K 4.0 08/18/2013 1110   CL 102 08/18/2013 1110   CO2 27 08/18/2013 1110   GLUCOSE 84 08/18/2013 1110   BUN 12 08/18/2013 1110   CREATININE 0.8 08/18/2013 1110   CALCIUM 9.3 08/18/2013 1110   PROT 7.6 08/18/2013 1110   ALBUMIN 4.3 08/18/2013 1110   AST 19 08/18/2013 1110   ALT 13 08/18/2013 1110   ALKPHOS 55 08/18/2013 1110   BILITOT 0.6 08/18/2013 1110      Lab Results  Component Value Date   TSH 2.52 08/18/2013      ASSESSMENT AND PLAN 48 y.o. year old female  has a past medical history of Congenital deficiency of other clotting factors; ECZEMA; THYROID NODULE; Palpitations; PVC's (premature ventricular contractions); Vertigo; and Protein S deficiency. here with:  1. Dizziness  The patient is some  research on her own. She believes that she could have Vertebrobasilar insufficiency. She started on an aspirin and feels that it has been beneficial. She states that she has not had any additional episodes of vertigo. However in the past year she has only had 2 episodes. At this time she would prefer just to take the aspirin and see if it prevents the vertigo sensations. She does not want to proceed with any further testing such as a MRA or CT angiogram. I am amenable to this. In the future if her sensations return she may find vestibular rehabilitation beneficial. Patient will return in 4-5 months or sooner if needed.    Ward Givens, MSN, NP-C 02/18/2014, 9:37 AM [facility] [address] [place], [place] [address] [phone]  Note: This document was prepared with digital dictation and possible smart phrase technology. Any transcriptional errors that result from this process are unintentional.

## 2014-02-18 NOTE — Progress Notes (Signed)
I agree with the assessment and plan as directed by NP .The patient is known to me .   Jameyah Fennewald, MD  

## 2014-02-18 NOTE — Patient Instructions (Signed)
Continue taking the Asprin daily. If the vertigo sensations return please let us know.

## 2014-03-26 ENCOUNTER — Other Ambulatory Visit: Payer: Self-pay | Admitting: Nurse Practitioner

## 2014-03-31 ENCOUNTER — Encounter: Payer: Self-pay | Admitting: Family

## 2014-03-31 ENCOUNTER — Ambulatory Visit (INDEPENDENT_AMBULATORY_CARE_PROVIDER_SITE_OTHER): Payer: 59 | Admitting: Family

## 2014-03-31 VITALS — BP 110/72 | HR 80 | Temp 98.1°F | Resp 18 | Ht 60.0 in | Wt 133.0 lb

## 2014-03-31 DIAGNOSIS — Z87440 Personal history of urinary (tract) infections: Secondary | ICD-10-CM

## 2014-03-31 MED ORDER — CEFUROXIME AXETIL 500 MG PO TABS: 500.0000 mg | ORAL_TABLET | ORAL | Status: DC | PRN

## 2014-03-31 NOTE — Patient Instructions (Addendum)
Thank you for choosing [facility].  Summary/Instructions:  Your prescription(s) have been submitted to your pharmacy. Please take as directed and contact our office if you believe you are having problem(s) with the medication(s).  If your symptoms worsen or fail to improve, please contact our office for further instruction, or in case of emergency go directly to the emergency room at the closest medical facility.      

## 2014-03-31 NOTE — Progress Notes (Signed)
Pre visit review using our clinic review tool, if applicable. No additional management support is needed unless otherwise documented below in the visit note. 

## 2014-03-31 NOTE — Progress Notes (Signed)
   Subjective:    Patient ID: Veronica Chen, female    DOB: February 04, 1966, 48 y.o.   MRN: [id number]  Chief Complaint  Patient presents with  . Urinary Tract Infection    has medication that she usually takes for UTIs, she gets them often and knows when she has them, would just like the rx refilled    HPI:  Veronica Chen is a 48 y.o. female who presents today for medication refill.  Has taken cefuroxime to help prevent urinary tract infections. She uses it when she has been traveling or when she feels sign of infection. Recently ran out of medication. Denies any current symptoms of urinary tract infection.  No Known Allergies  Current Outpatient Prescriptions on File Prior to Visit  Medication Sig Dispense Refill  . aspirin 81 MG tablet Take 81 mg by mouth daily.    Marland Kitchen doxycycline (VIBRAMYCIN) 100 MG capsule Take 100 mg by mouth daily as needed.     . fluticasone (FLONASE) 50 MCG/ACT nasal spray Place 2 sprays into both nostrils daily. 16 g 6   No current facility-administered medications on file prior to visit.   Review of Systems    See HPI  Objective:    BP 110/72 mmHg  Pulse 80  Temp(Src) 98.1 F (36.7 C) (Oral)  Resp 18  Ht 5' (1.524 m)  Wt 133 lb (60.328 kg)  BMI 25.97 kg/m2  SpO2 96% Nursing note and vital signs reviewed.  Physical Exam  Constitutional: She is oriented to person, place, and time. She appears well-developed and well-nourished. No distress.  Cardiovascular: Normal rate, regular rhythm, normal heart sounds and intact distal pulses.   Pulmonary/Chest: Effort normal and breath sounds normal.  Abdominal: There is no CVA tenderness.  Neurological: She is alert and oriented to person, place, and time.  Skin: Skin is warm and dry.  Psychiatric: She has a normal mood and affect. Her behavior is normal. Judgment and thought content normal.      Assessment & Plan:

## 2014-03-31 NOTE — Assessment & Plan Note (Signed)
Patient not currently experiencing symptoms. Prophylactically he was maintained on Ceftin as needed. Continue Ceftin with medication refill. Patient will follow up with GYN regarding chronic UTIs. Follow up as needed or if symptoms worsen.

## 2014-07-20 ENCOUNTER — Encounter: Payer: Self-pay | Admitting: Adult Health

## 2014-07-20 ENCOUNTER — Ambulatory Visit (INDEPENDENT_AMBULATORY_CARE_PROVIDER_SITE_OTHER): Payer: 59 | Admitting: Adult Health

## 2014-07-20 VITALS — BP 105/64 | HR 81 | Ht 60.0 in | Wt 134.0 lb

## 2014-07-20 DIAGNOSIS — R42 Dizziness and giddiness: Secondary | ICD-10-CM

## 2014-07-20 NOTE — Progress Notes (Signed)
I agree with the assessment and plan as directed by NP .The patient is known to me .   Navid Lenzen, MD  

## 2014-07-20 NOTE — Patient Instructions (Signed)
Continue Aspirin  If dizziness worsens please let us know.

## 2014-07-20 NOTE — Progress Notes (Signed)
PATIENT: Veronica Chen DOB: Jun 21, 1965  REASON FOR VISIT: follow up- vertigo HISTORY FROM: patient  HISTORY OF PRESENT ILLNESS: Veronica Chen is a 49 year old female with a history of vertigo. She returns today for follow-up. She states that her dizziness has gotten significantly better in the last 4 months. She states that she's had no major spells. She continues to take aspirin daily and feels that this has been beneficial. She does state that she was at a volleyball game and the head movements and looking back in for caused her to feel dizzy. She states that she stepped out of the gym and it resolved. She states that when the dizziness occurs it feels like she is walking on uneven surfaces. She denies any new neurological symptoms. Denies any new medical issues.  HISTORY 02/18/14: Veronica Chen is a 49 year old female with a history of vertigo. She returns today for follow-up. She had an MRI/MRV that was normal. TCD showed elevated cerebral blood flow velocities in the majority of arteries to the anterior and posterior circulations as well as asymmetrically abnormal elevated cerebral blood flow velocities in the right carotid siphon. Patient reports that she has not had an additional episodes since she started aspirin. She states that she did research on Vertebrobasilar insufficiency and she believes she has all those symptoms but mildly. She started the aspirin on her own and reports that she does feel better taking the aspirin. She states that could just be a placebo effect. She states that the vertigo does not happen often but it can sometimes occur after a brisk movement of the head or when she is sitting still. She reports that she sometimes will get a headache after the vertigo but that does not always occur.   HISTORY 01/28/14 (CD): Thank you for allowing me to participate in your patients care. This pleasant, right handed, married , caucasian female reports a strong family history of migraine  in her father and cyclic vomiting in her son. She has been experiencing Vertigo with sudden onset and violent nausea. The patient experienced the first spell 8 years ago -while sitting in a restaurant at a table and the onset was without any warning or aura, "suddenly the room started spinning" and she felt unable to move, she had to hold onto the arm chair she sat in felt unsteady to even rise or walk and her husband had to help her and guided her to the bathroom. The vertigo was spinning around her and she felt as if she had just gotten off an amusement park ride. The vertigo was clockwise.The vertigo remained present for a day and night. She went to [facility] close to her home, and her condition was diagnosed as vertigo and was given Meclizine- an MRI of the brain was ordered and read as normal. Last year she had 2 significant episodes. One time she was shopping, tried on clothes and as she looked up to the right had another sudden vertigo spell, this time she controlled it by focusing visually on one object and this calms her vertigo and abbreviates the spell, she has diaphoresis but less when focusing. She has a slight headache after these spells and her eyelids "become so heavy" and she is "overwhelmingly sleepy". Her eye movements feel irregular, "jumpy" during a spell. She has been unable to read, the words blurr. There is photophobia with these spells. There is phonophobia- to deep base sounds. She felt off balance. Her son has some of  these symptoms and her son wears sunglasses, she has adapted to this step , too. she reports sleeping poorly, 4-6 hours , often with the help of Advil PM. She is still menstruating , has not noted a cyclic pattern to her headaches. She patient has a protein S deficiency and is at higher risk of developing blood clots.  REVIEW OF SYSTEMS: Out of a complete 14 system review of symptoms, the patient complains only of the following symptoms, and all other  reviewed systems are negative. See HPI     ALLERGIES: No Known Allergies  HOME MEDICATIONS: Outpatient Prescriptions Prior to Visit  Medication Sig Dispense Refill  . aspirin 81 MG tablet Take 81 mg by mouth daily.    . cefUROXime (CEFTIN) 500 MG tablet Take 1 tablet (500 mg total) by mouth as needed (as needed for utis). 10 tablet 1  . fluticasone (FLONASE) 50 MCG/ACT nasal spray Place 2 sprays into both nostrils daily. 16 g 6  . doxycycline (VIBRAMYCIN) 100 MG capsule Take 100 mg by mouth daily as needed.      No facility-administered medications prior to visit.    PAST MEDICAL HISTORY: Past Medical History  Diagnosis Date  . 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 ECZEMA   . THYROID NODULE   . Palpitations   . PVC's (premature ventricular contractions)   . Vertigo   . Protein S deficiency     PAST SURGICAL HISTORY: Past Surgical History  Procedure Laterality Date  . Uterine ablation    . Cesarean section      x2    FAMILY HISTORY: Family History  Problem Relation Age of Onset  . Heart attack      SOCIAL HISTORY: History   Social History  . Marital Status: Married    Spouse Name: N/A  . Number of Children: 2  . Years of Education: college 2   Occupational History  . Real [employer]    Social History Main Topics  . Smoking status: Never Smoker   . 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 Alcohol Use: No  . Drug Use: No  . Sexual Activity: Not on file   Other Topics Concern  . Not on file   Social History Narrative   Patient lives at home with husband [name]    Patient has 2 children.    Patient has a college education.    Patient is right handed.       PHYSICAL EXAM  Filed Vitals:   07/20/14 1043  BP: 105/64  Pulse: 81  Height: 5' (1.524 m)  Weight: 134 lb (60.782 kg)   Body mass index is 26.17  kg/(m^2). Generalized: Well developed, in no acute distress   Neurological examination  Mentation: Alert oriented to time, place, history taking. Follows all commands speech and language fluent Cranial nerve II-XII: Pupils were equal round reactive to light. Extraocular movements were full, visual field were full on confrontational test. In the nystagmus noted when eyes move horizontally to the right. Facial sensation and strength were normal. Uvula tongue midline. Head turning and shoulder shrug  were normal and symmetric. Motor: The motor testing reveals 5 over 5 strength of all 4 extremities. Good symmetric motor tone is noted throughout.  Sensory: Sensory testing is intact to soft touch on all 4 extremities. No evidence of extinction is  noted.  Coordination: Cerebellar testing reveals good finger-nose-finger and heel-to-shin bilaterally.  Gait and station: Gait is normal. Tandem gait is normal. Romberg is negative. No drift is seen.  Reflexes: Deep tendon reflexes are symmetric and normal bilaterally.    DIAGNOSTIC DATA (LABS, IMAGING, TESTING) - I reviewed patient records, labs, notes, testing and imaging myself where available.      ASSESSMENT AND PLAN 49 y.o. year old female  has a past medical history of Congenital deficiency of other clotting factors; ECZEMA; THYROID NODULE; Palpitations; PVC's (premature ventricular contractions); Vertigo; and Protein S deficiency. here with:  1. Vertigo  The patient's dizziness has improved. She will continue taking aspirin daily. If her dizziness worsens we can consider vestibular rehabilitation. The patient will let us know if her symptoms worsen or she develops new symptoms. She will follow-up in one year or sooner if needed.  Ward Givens, MSN, NP-C 07/20/2014, 10:59 AM [facility] [address], [place], [place] [address] [phone]  Note: This document was prepared with digital dictation and possible  smart phrase technology. Any transcriptional errors that result from this process are unintentional.

## 2014-08-05 ENCOUNTER — Telehealth: Payer: Self-pay | Admitting: Internal Medicine

## 2014-08-05 MED ORDER — CEFUROXIME AXETIL 500 MG PO TABS: 500.0000 mg | ORAL_TABLET | ORAL | Status: DC | PRN

## 2014-08-05 NOTE — Telephone Encounter (Signed)
Pt request refill for cefUROXime (CEFTIN) 500 MG tablet to be send to [facility]. Please help

## 2014-08-05 NOTE — Telephone Encounter (Signed)
Medication refill sent .

## 2014-09-29 ENCOUNTER — Other Ambulatory Visit (HOSPITAL_COMMUNITY): Payer: Self-pay | Admitting: Obstetrics and Gynecology

## 2014-09-29 DIAGNOSIS — Z8639 Personal history of other endocrine, nutritional and metabolic disease: Secondary | ICD-10-CM

## 2014-10-05 ENCOUNTER — Ambulatory Visit (HOSPITAL_COMMUNITY)
Admission: RE | Admit: 2014-10-05 | Discharge: 2014-10-05 | Disposition: A | Discharge status: Home / Self Care | Payer: 59 | Source: Ambulatory Visit | Attending: Obstetrics and Gynecology | Admitting: Obstetrics and Gynecology

## 2014-10-05 DIAGNOSIS — Z8639 Personal history of other endocrine, nutritional and metabolic disease: Secondary | ICD-10-CM

## 2014-10-05 DIAGNOSIS — E041 Nontoxic single thyroid nodule: Secondary | ICD-10-CM | POA: Diagnosis not present

## 2015-04-20 ENCOUNTER — Encounter: Payer: Self-pay | Admitting: Internal Medicine

## 2015-04-20 ENCOUNTER — Ambulatory Visit (INDEPENDENT_AMBULATORY_CARE_PROVIDER_SITE_OTHER): Payer: 59 | Admitting: Internal Medicine

## 2015-04-20 ENCOUNTER — Ambulatory Visit: Payer: 59 | Admitting: Internal Medicine

## 2015-04-20 VITALS — BP 108/68 | HR 85 | Temp 98.2°F | Resp 16 | Wt 133.0 lb

## 2015-04-20 DIAGNOSIS — R42 Dizziness and giddiness: Secondary | ICD-10-CM | POA: Diagnosis not present

## 2015-04-20 DIAGNOSIS — D6859 Other primary thrombophilia: Secondary | ICD-10-CM

## 2015-04-20 DIAGNOSIS — Z23 Encounter for immunization: Secondary | ICD-10-CM | POA: Diagnosis not present

## 2015-04-20 NOTE — Patient Instructions (Signed)
  All other Health Maintenance issues reviewed.   All recommended immunizations and age-appropriate screenings are up-to-date.  Flu vaccine administered today.   Medications reviewed and updated.   No changes recommended at this time.   Please followup as needed

## 2015-04-20 NOTE — Progress Notes (Signed)
Subjective:    Patient ID: Veronica Chen, female    DOB: 08/07/1965, 49 y.o.   MRN: [id number]  HPI She is here to establish with a new pcp.   Protein S deficiency: She is a history of protein S deficiency. Her father had the same and had several blood clots and complications from them. She denies a history of blood clots. She was diagnosed during one of her pregnancies. She takes a baby aspirin daily.  Thyroid nodule: This is currently being followed elsewhere. She states it has been stable.   Medications and allergies reviewed with patient and updated if appropriate.  Patient Active Problem List   Diagnosis Date Noted  . History of chronic urinary tract infection 03/31/2014  . Urgency of urination 12/18/2013  . Urethritis 12/18/2013  . Capsulitis of foot 02/04/2013  . Sesamoiditis 02/04/2013  . Allergic rhinitis   . Osteopenia   . THYROID NODULE 03/23/2010  . CONGENITAL DEFICIENCY OF OTHER CLOTTING FACTORS 03/23/2010  . ECZEMA 03/23/2010    Current Outpatient Prescriptions on File Prior to Visit  Medication Sig Dispense Refill  . aspirin 81 MG tablet Take 81 mg by mouth daily.    . fluticasone (FLONASE) 50 MCG/ACT nasal spray Place 2 sprays into both nostrils daily. 16 g 6   No current facility-administered medications on file prior to visit.    Past Medical History  Diagnosis Date  . 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 ECZEMA   . THYROID NODULE   . Palpitations   . PVC's (premature ventricular contractions)   . Vertigo   . Protein S deficiency [facility])     Past Surgical History  Procedure Laterality Date  . Uterine ablation    . Cesarean section      x2    Social History   Social History  . Marital Status: Married    Spouse Name: N/A  . Number of Children: 2  . Years of Education: college 2   Occupational History  . [employer]    Social History Main Topics  . Smoking status: Never Smoker     . 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 Alcohol Use: No  . Drug Use: No  . Sexual Activity: Not Asked   Other Topics Concern  . None   Social History Narrative   Patient lives at home with husband Veronica Chen    Patient has 2 children.    Patient has a college education.    Patient is right handed.     Review of Systems  Constitutional: Negative for fever and chills.  Respiratory: Negative for cough, shortness of breath and wheezing.   Cardiovascular: Negative for chest pain, palpitations and leg swelling.  Gastrointestinal: Negative for nausea and abdominal pain.       No GERD  Neurological: Negative for dizziness, light-headedness and headaches.  Psychiatric/Behavioral: Negative for dysphoric mood. The patient is not nervous/anxious.        Objective:   Filed Vitals:   04/20/15 1337  BP: 108/68  Pulse: 85  Temp: 98.2 F (36.8 C)  Resp: 16   Filed Weights   04/20/15 1337  Weight: 133 lb (60.328 kg)   Body mass index is 25.97 kg/(m^2).   Physical Exam Constitutional: Appears well-developed and well-nourished. No distress.  Neck: Neck supple. No tracheal deviation present.  No thyromegaly present.  No carotid bruit. No cervical adenopathy.   Cardiovascular: Normal rate, regular rhythm and normal heart sounds.   No murmur heard. Pulmonary/Chest: Effort normal and breath sounds normal. No respiratory distress. No wheezes.  Musculoskeletal: No edema.          Assessment & Plan:   See Problem List.  Follow up as needed

## 2015-04-20 NOTE — Progress Notes (Signed)
Pre visit review using our clinic review tool, if applicable. No additional management support is needed unless otherwise documented below in the visit note. 

## 2015-04-20 NOTE — Assessment & Plan Note (Signed)
Taking baby aspirin daily No history of DVT or PE Father has protein S deficiency

## 2015-07-19 ENCOUNTER — Ambulatory Visit: Payer: 59 | Admitting: Adult Health

## 2015-09-14 ENCOUNTER — Ambulatory Visit (INDEPENDENT_AMBULATORY_CARE_PROVIDER_SITE_OTHER): Payer: 59 | Admitting: Family

## 2015-09-14 ENCOUNTER — Encounter: Payer: Self-pay | Admitting: Family

## 2015-09-14 VITALS — BP 102/68 | HR 78 | Temp 97.9°F | Resp 16 | Ht 60.0 in | Wt 133.8 lb

## 2015-09-14 DIAGNOSIS — H6123 Impacted cerumen, bilateral: Secondary | ICD-10-CM | POA: Diagnosis not present

## 2015-09-14 NOTE — Progress Notes (Signed)
   Subjective:    Patient ID: Veronica Chen, female    DOB: 06-30-1965, 50 y.o.   MRN: [id number]  Chief Complaint  Patient presents with  . Ear issues    left ear bothering her for a while, sometimes when she lays on that side it hurts    HPI:  Veronica Chen is a 50 y.o. female who  has a past medical history of Congenital deficiency of other clotting factors; ECZEMA; THYROID NODULE; Palpitations; PVC's (premature ventricular contractions); Vertigo; and Protein S deficiency ([place]). and presents today for an acute office visit.   This is a new problem. Associated symptom of discomfort located in her left ear has been going on for a while. Describes when she lies on her left side there is some pain. Denies discharge or fever.  No Known Allergies   Current Outpatient Prescriptions on File Prior to Visit  Medication Sig Dispense Refill  . aspirin 81 MG tablet Take 81 mg by mouth daily.    Marland Kitchen b complex vitamins tablet Take 1 tablet by mouth daily.    . fluticasone (FLONASE) 50 MCG/ACT nasal spray Place 2 sprays into both nostrils daily. 16 g 6  . VITAMIN E PO Take by mouth.     No current facility-administered medications on file prior to visit.    Review of Systems  Constitutional: Negative for fever and chills.  HENT: Positive for ear pain. Negative for ear discharge.   Neurological: Negative for headaches.      Objective:    BP 102/68 mmHg  Pulse 78  Temp(Src) 97.9 F (36.6 C) (Oral)  Resp 16  Ht 5' (1.524 m)  Wt 133 lb 12.8 oz (60.691 kg)  BMI 26.13 kg/m2  SpO2 97% Nursing note and vital signs reviewed.  Physical Exam  Constitutional: She is oriented to person, place, and time. She appears well-developed and well-nourished. No distress.  HENT:  Right Ear: External ear and ear canal normal.  Left Ear: External ear and ear canal normal.  Impacted cerumen noted bilaterally.   Cardiovascular: Normal rate, regular rhythm, normal heart sounds and intact distal pulses.     Pulmonary/Chest: Effort normal and breath sounds normal.  Neurological: She is alert and oriented to person, place, and time.  Skin: Skin is warm and dry.  Psychiatric: She has a normal mood and affect. Her behavior is normal. Judgment and thought content normal.       Assessment & Plan:   Problem List Items Addressed This Visit      Nervous and Auditory   Impacted cerumen of both ears - Primary    Both ears were cleaned out with warm water mixed with Docusate Sodium. There was wax return from both ears and ear drum was visible. Patient tolerated this well. Solution for ear wax prevention was provided.         I am having [name] maintain her fluticasone, aspirin, b complex vitamins, and VITAMIN E PO.   Follow-up: Return if symptoms worsen or fail to improve.  Mauricio Po, FNP

## 2015-09-14 NOTE — Progress Notes (Signed)
Pre visit review using our clinic review tool, if applicable. No additional management support is needed unless otherwise documented below in the visit note. 

## 2015-09-14 NOTE — Assessment & Plan Note (Addendum)
Both ears were cleaned out with warm water mixed with Docusate Sodium. There was wax return from both ears and ear drum was visible. Patient tolerated this well. Solution for ear wax prevention was provided.

## 2015-09-14 NOTE — Patient Instructions (Signed)
Thank you for choosing [facility].  Summary/Instructions:  If your symptoms worsen or fail to improve, please contact our office for further instruction, or in case of emergency go directly to the emergency room at the closest medical facility.   To prevent wax buildup within the ear:   Use equal parts of water and white vinegar  Soak a cotton ball in the solution and place several drops within the ear  Insert cotton ball in external ear canal and let sit for 30 minutes prior to shower  Remove cotton ball and gently irrigate the ear canal in the shower.  Do not irrigate directly into the ear but rather let it hit the external canal and irrigate.  For maintenance, this can be done 1 time weekly.  Cerumen Impaction The structures of the external ear canal secrete a waxy substance known as cerumen. Excess cerumen can build up in the ear canal, causing a condition known as cerumen impaction. Cerumen impaction can cause ear pain and disrupt the function of the ear. The rate of cerumen production differs for each individual. In certain individuals, the configuration of the ear canal may decrease his or her ability to naturally remove cerumen. CAUSES Cerumen impaction is caused by excessive cerumen production or buildup. RISK FACTORS  Frequent use of swabs to clean ears.  Having narrow ear canals.  Having eczema.  Being dehydrated. SIGNS AND SYMPTOMS  Diminished hearing.  Ear drainage.  Ear pain.  Ear itch. TREATMENT Treatment may involve:  Over-the-counter or prescription ear drops to soften the cerumen.  Removal of cerumen by a health care provider. This may be done with:  Irrigation with warm water. This is the most common method of removal.  Ear curettes and other instruments.  Surgery. This may be done in severe cases. HOME CARE INSTRUCTIONS  Take medicines only as directed by your health care provider.  Do not insert objects into the ear with the  intent of cleaning the ear. PREVENTION  Do not insert objects into the ear, even with the intent of cleaning the ear. Removing cerumen as a part of normal hygiene is not necessary, and the use of swabs in the ear canal is not recommended.  Drink enough water to keep your urine clear or pale yellow.  Control your eczema if you have it. SEEK MEDICAL CARE IF:  You develop ear pain.  You develop bleeding from the ear.  The cerumen does not clear after you use ear drops as directed.   This information is not intended to replace advice given to you by your health care provider. Make sure you discuss any questions you have with your health care provider.   Document Released: 05/18/2004 Document Revised: 05/01/2014 Document Reviewed: 11/25/2014 Elsevier Interactive Patient Education [employer].

## 2015-12-14 ENCOUNTER — Encounter: Payer: Self-pay | Admitting: Internal Medicine

## 2015-12-14 ENCOUNTER — Ambulatory Visit (INDEPENDENT_AMBULATORY_CARE_PROVIDER_SITE_OTHER)
Admission: RE | Admit: 2015-12-14 | Discharge: 2015-12-14 | Disposition: A | Discharge status: Home / Self Care | Payer: 59 | Source: Ambulatory Visit | Attending: Internal Medicine | Admitting: Internal Medicine

## 2015-12-14 ENCOUNTER — Ambulatory Visit (INDEPENDENT_AMBULATORY_CARE_PROVIDER_SITE_OTHER): Payer: 59 | Admitting: Internal Medicine

## 2015-12-14 VITALS — BP 124/84 | HR 78 | Temp 98.3°F | Resp 16 | Wt 132.0 lb

## 2015-12-14 DIAGNOSIS — M542 Cervicalgia: Secondary | ICD-10-CM | POA: Diagnosis not present

## 2015-12-14 DIAGNOSIS — R200 Anesthesia of skin: Secondary | ICD-10-CM | POA: Insufficient documentation

## 2015-12-14 DIAGNOSIS — M545 Low back pain, unspecified: Secondary | ICD-10-CM | POA: Insufficient documentation

## 2015-12-14 DIAGNOSIS — M546 Pain in thoracic spine: Secondary | ICD-10-CM | POA: Diagnosis not present

## 2015-12-14 DIAGNOSIS — R202 Paresthesia of skin: Secondary | ICD-10-CM

## 2015-12-14 MED ORDER — CYCLOBENZAPRINE HCL 5 MG PO TABS: 5.0000 mg | ORAL_TABLET | Freq: Every day | ORAL | 0 refills | Status: DC

## 2015-12-14 NOTE — Progress Notes (Signed)
Pre visit review using our clinic review tool, if applicable. No additional management support is needed unless otherwise documented below in the visit note. 
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
## 2015-12-14 NOTE — Assessment & Plan Note (Signed)
Pain with palpation of lumbar spine Check xray
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
## 2015-12-14 NOTE — Patient Instructions (Signed)
Have the xrays done.   Try the muscle relaxer at night if needed  Take advil with food.    Use heat  Call or return if no improvement.
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
## 2015-12-15 ENCOUNTER — Telehealth: Payer: Self-pay | Admitting: Internal Medicine

## 2015-12-15 NOTE — Telephone Encounter (Signed)
Pt called request result for the x-ray. Please call her back

## 2015-12-16 NOTE — Telephone Encounter (Signed)
Spoke with pt to inform.  

## 2016-01-06 ENCOUNTER — Telehealth: Payer: Self-pay

## 2016-01-06 DIAGNOSIS — M545 Low back pain: Secondary | ICD-10-CM

## 2016-01-06 DIAGNOSIS — M542 Cervicalgia: Secondary | ICD-10-CM
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
## 2016-01-07 NOTE — Telephone Encounter (Signed)
Please advise 

## 2016-01-07 NOTE — Telephone Encounter (Signed)
Pt does not have a specific PT office that she would like to go to. Please place referral.

## 2016-01-07 NOTE — Telephone Encounter (Signed)
I think PT would be a good idea - where would she like to go

## 2016-01-10 NOTE — Telephone Encounter (Signed)
Referral ordered

## 2016-01-18 ENCOUNTER — Other Ambulatory Visit: Payer: Self-pay | Admitting: Internal Medicine

## 2016-01-26 ENCOUNTER — Ambulatory Visit: Payer: 59 | Attending: Internal Medicine | Admitting: Physical Therapy

## 2016-01-26 DIAGNOSIS — M6281 Muscle weakness (generalized): Secondary | ICD-10-CM | POA: Insufficient documentation

## 2016-01-26 DIAGNOSIS — M545 Low back pain, unspecified: Secondary | ICD-10-CM

## 2016-01-26 DIAGNOSIS — M542 Cervicalgia: Secondary | ICD-10-CM | POA: Diagnosis present
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
## 2016-02-01 ENCOUNTER — Ambulatory Visit: Payer: 59 | Admitting: Physical Therapy

## 2016-02-01 DIAGNOSIS — M545 Low back pain, unspecified: Secondary | ICD-10-CM

## 2016-02-01 DIAGNOSIS — M6281 Muscle weakness (generalized): Secondary | ICD-10-CM

## 2016-02-01 DIAGNOSIS — M542 Cervicalgia: Secondary | ICD-10-CM

## 2016-02-01 NOTE — Therapy (Signed)
[place], [place], [address] Phone: [phone]   Fax:  [phone]  Physical Therapy Treatment  Patient Details  Name: Veronica Chen MRN: [id number] Date of Birth: 10-Feb-1966 Referring Provider: Billey Gosling, MD  Encounter Date: 02/01/2016      PT End of Session - 02/01/16 1820    Visit Number 2   Number of Visits 16   Date for PT Re-Evaluation 03/27/16   Authorization Type FOTO by 10th visit   PT Start Time 1015   PT Stop Time 1058   PT Time Calculation (min) 43 min   Activity Tolerance Patient tolerated treatment well   Behavior During Therapy [facility] for tasks assessed/performed      Past Medical History:  Diagnosis Date  . 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 ECZEMA   . Palpitations   . Protein S deficiency ([place])   . PVC's (premature ventricular contractions)   . THYROID NODULE   . Vertigo     Past Surgical History:  Procedure Laterality Date  . CESAREAN SECTION     x2  . uterine ablation      There were no vitals filed for this visit.      Subjective Assessment - 02/01/16 1037    Subjective (P)  Patient reported increased pain with exercises. Side bending caused her a headache. Her back exercises helped. She feels like her back pain is becoming managemable depsite still reporting 7/10 pain. Her chief complaint is her neck which she reports is effecting her ability to do her work.    Limitations (P)  House hold activities;Lifting;Other (comment)   How long can you sit comfortably? (P)  30 minutes   How long can you stand comfortably? (P)  unlimited   How long can you walk comfortably? (P)  unlimited   Diagnostic tests (P)  X-ray, reveals arthritis   Patient Stated Goals (P)  Be ble to return to PLOF and be able to exercise without pain   Currently in Pain? (P)  Yes   Pain Score (P)  6    Pain Location (P)  Neck   Pain Orientation  (P)  Lower   Pain Descriptors / Indicators (P)  Throbbing   Pain Type (P)  Acute pain   Pain Radiating Towards (P)  into right trap with occasional tingling into her hand    Pain Onset (P)  More than a month ago   Aggravating Factors  (P)  reaching, turning head    Pain Relieving Factors (P)  ibuprofin, standing,m walking, heat                          [facility] Adult PT Treatment/Exercise - 02/01/16 0001      Neck Exercises: Standing   Other Standing Exercises shoulder extesnion 2x10 yellow  shoulder row 2x10 yellow      Neck Exercises: Seated   Other Seated Exercise Pain fre range cervical rotation to maintain rotation 2x3                   PT Short Term Goals - 02/01/16 1827      PT SHORT TERM GOAL #1   Title Pt will be independent in her HEP in order to improve functional mobility and decrease pain.    Baseline issued HEP on 01/26/16   Time 4   Period Weeks  Status On-going     PT SHORT TERM GOAL #2   Title Pt will improve her cervical rotation by 5 degrees both directions with pain less than 4/10 in order to improve driving safety with lane changes.    Baseline R=52 degrees, L=55 degrees   Time 4   Period Weeks   Status On-going           PT Long Term Goals - 01/27/16 0950      PT LONG TERM GOAL #1   Title Pt will be able to improve her FOTO score from 55% limitation to </= 40% limitation.    Baseline 55% limitation on 01/26/16   Time 8   Period Weeks   Status New     PT LONG TERM GOAL #2   Title Pt will be able to donn her shoes with no pain reported in a sitting position.    Baseline unable to put on her shoes without assistance or pain increasing to 7-8/10.    Time 8   Period Weeks   Status New     PT LONG TERM GOAL #3   Title Pt will be perform head turns to bilateral directions with pain less than 2/10 and no LOB or dizziness noted.    Time 8   Period Weeks   Status New     PT LONG TERM GOAL #4   Title Pt will be able to  tolerate sitting for greater than/= 60 minutes.    Time 8   Period Weeks   Status New     PT LONG TERM GOAL #5   Title Pt will be able to retrieve an object weighting 10 # off the floor using correct body mechanics.    Time 8   Period Weeks   Status New     Additional Long Term Goals   Additional Long Term Goals Yes     PT LONG TERM GOAL #6   Title Pt will be able to return to using her total gym and ellipiticl pain free.    Baseline causes incresed pain    Time 8   Period Weeks   Status New               Plan - 02/01/16 1825    Clinical Impression Statement Therapy focused on manual therapy to the patients cervical spine. Therapy reviewed some light stretching for the lumbar spine but she reports she feels like she is able to manage her lumbar spine pain better. She reported feeling a significant improvement in cervical pain with treatment.     Rehab Potential Excellent   PT Frequency 2x / week   PT Duration 8 weeks   PT Treatment/Interventions ADLs/Self Care Home Management;Moist Heat;Traction;Ultrasound;Electrical Stimulation;Iontophoresis 4mg /ml Dexamethasone;Functional mobility training;Gait training;Therapeutic activities;Therapeutic exercise;Neuromuscular re-education;Patient/family education;Passive range of motion;Manual techniques;Dry needling;Taping;Cryotherapy   PT Next Visit Plan further asses UE strength if seen by PT, core strengthening, soft tissue mobilizations of cervical spine, grade 2 mobilizations of cervical spine as pt tolerates, recommendaions for possible dry needling   PT Home Exercise Plan Continue to focus on decreasing muscle spasm and improvingposture. 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 Add light core stregthening. Patient given posture exercises with core stabilization.    Consulted and Agree with Plan of Care Patient      Patient will benefit from skilled therapeutic intervention in order to improve the following deficits and  impairments:  Decreased range of motion, Difficulty walking, Impaired UE functional  use, Decreased activity tolerance, Impaired perceived functional ability, Pain, Postural dysfunction, Decreased mobility, Decreased strength, Improper body mechanics  Visit Diagnosis: Acute midline low back pain without sciatica  Cervicalgia  Muscle weakness (generalized)     Problem List Patient Active Problem List   Diagnosis Date Noted  . Right-sided thoracic back pain 12/14/2015  . Numbness and tingling of right arm 12/14/2015  . Neck pain 12/14/2015  . Low back pain 12/14/2015  . Vertigo 04/20/2015  . History of chronic urinary tract infection 03/31/2014  . Urgency of urination 12/18/2013  . Urethritis 12/18/2013  . Capsulitis of foot 02/04/2013  . Sesamoiditis 02/04/2013  . Allergic rhinitis   . Osteopenia   . THYROID NODULE 03/23/2010  . Protein S deficiency ([place]) 03/23/2010  . ECZEMA 03/23/2010    Carney Living PT DPT  02/01/2016, 6:31 PM  [facility] [address] [place], [place], [address] Phone: [phone]   Fax:  [phone]  Name: Veronica Chen MRN: [id number] Date of Birth: December 01, 1965

## 2016-02-03 ENCOUNTER — Ambulatory Visit: Payer: 59 | Admitting: Physical Therapy

## 2016-02-03 DIAGNOSIS — M6281 Muscle weakness (generalized): Secondary | ICD-10-CM

## 2016-02-03 DIAGNOSIS — M545 Low back pain, unspecified: Secondary | ICD-10-CM

## 2016-02-03 DIAGNOSIS — M542 Cervicalgia: Secondary | ICD-10-CM

## 2016-02-03 NOTE — Therapy (Signed)
[place], [place], [address] Phone: [phone]   Fax:  [phone]  Physical Therapy Treatment  Patient Details  Name: Veronica Chen MRN: [id number] Date of Birth: [birth year] Referring Provider: Billey Gosling, MD  Encounter Date: 02/03/2016      PT End of Session - 02/03/16 1518    Visit Number 3   Number of Visits 16   Date for PT Re-Evaluation 03/27/16   Authorization Type FOTO by 10th visit   PT Start Time 0800   PT Stop Time 0844   PT Time Calculation (min) 44 min   Activity Tolerance Patient tolerated treatment well   Behavior During Therapy [facility] for tasks assessed/performed      Past Medical History:  Diagnosis Date  . 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 ECZEMA   . Palpitations   . Protein S deficiency ([place])   . PVC's (premature ventricular contractions)   . THYROID NODULE   . Vertigo     Past Surgical History:  Procedure Laterality Date  . CESAREAN SECTION     x2  . uterine ablation      There were no vitals filed for this visit.      Subjective Assessment - 02/03/16 0805    Subjective (P)  Patient reports she is no longer having sharp pain into her neck. She has been able to work more. she is still having some tightness in her neck.    Limitations (P)  House hold activities;Lifting;Other (comment)   How long can you sit comfortably? (P)  30 minutes   How long can you stand comfortably? (P)  unlimited   How long can you walk comfortably? (P)  unlimited   Diagnostic tests (P)  X-ray, reveals arthritis   Patient Stated Goals (P)  Be ble to return to PLOF and be able to exercise without pain   Pain Score (P)  5                          [facility] Adult PT Treatment/Exercise - 02/03/16 0001      Neck Exercises: Standing   Other Standing Exercises shoulder extesnion 2x10 yellow  shoulder row 2x10 yellow      Neck Exercises: Supine   Other Supine Exercise Supine yellow bilateral ER 2x10; Shoulder flexion with abduction 2x10      Manual Therapy   Manual Therapy Soft tissue mobilization   Manual therapy comments Soft tissue mobilization to left upper trap. IASTYM to left upper trap; Manual trigger point release to upper trap and cervical spine.                   PT Short Term Goals - 02/01/16 1827      PT SHORT TERM GOAL #1   Title Pt will be independent in her HEP in order to improve functional mobility and decrease pain.    Baseline issued HEP on 01/26/16   Time 4   Period Weeks   Status On-going     PT SHORT TERM GOAL #2   Title Pt will improve her cervical rotation by 5 degrees both directions with pain less than 4/10 in order to improve driving safety with lane changes.    Baseline R=52 degrees, L=55 degrees   Time 4   Period Weeks   Status On-going  PT Long Term Goals - 01/27/16 0950      PT LONG TERM GOAL #1   Title Pt will be able to improve her FOTO score from 55% limitation to </= 40% limitation.    Baseline 55% limitation on 01/26/16   Time 8   Period Weeks   Status New     PT LONG TERM GOAL #2   Title Pt will be able to donn her shoes with no pain reported in a sitting position.    Baseline unable to put on her shoes without assistance or pain increasing to 7-8/10.    Time 8   Period Weeks   Status New     PT LONG TERM GOAL #3   Title Pt will be perform head turns to bilateral directions with pain less than 2/10 and no LOB or dizziness noted.    Time 8   Period Weeks   Status New     PT LONG TERM GOAL #4   Title Pt will be able to tolerate sitting for greater than/= 60 minutes.    Time 8   Period Weeks   Status New     PT LONG TERM GOAL #5   Title Pt will be able to retrieve an object weighting 10 # off the floor using correct body mechanics.    Time 8   Period Weeks   Status New     Additional Long Term Goals   Additional Long  Term Goals Yes     PT LONG TERM GOAL #6   Title Pt will be able to return to using her total gym and ellipiticl pain free.    Baseline causes incresed pain    Time 8   Period Weeks   Status New               Plan - 02/03/16 1518    Clinical Impression Statement Patient tolerated treatment well. she continues to have significant spasming in her cervical spine but her pain has improved. therapy added 2 more postural exercises. She had no increase in pain. She is stil having pain in the lower back in the morning. Therapy focused on her neck again today. She has been doing her stretches for her back.     Rehab Potential Good   PT Frequency 2x / week   PT Duration 8 weeks   PT Treatment/Interventions ADLs/Self Care Home Management;Moist Heat;Traction;Ultrasound;Electrical Stimulation;Iontophoresis 4mg /ml Dexamethasone;Functional mobility training;Gait training;Therapeutic activities;Therapeutic exercise;Neuromuscular re-education;Patient/family education;Passive range of motion;Manual techniques;Dry needling;Taping;Cryotherapy   PT Next Visit Plan Continue with soft tissue mobilization, continue with postural corection, considersoft tissue work on the back. Continue to work on whatever is giving her the biggest problem.    PT Home Exercise Plan Continue to focus on decreasing muscle spasm and improvingposture. 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 Add light core stregthening. Patient given posture exercises with core stabilization.    Consulted and Agree with Plan of Care Patient      Patient will benefit from skilled therapeutic intervention in order to improve the following deficits and impairments:  Decreased range of motion, Difficulty walking, Impaired UE functional use, Decreased activity tolerance, Impaired perceived functional ability, Pain, Postural dysfunction, Decreased mobility, Decreased strength, Improper body mechanics  Visit Diagnosis: Acute midline low back  pain without sciatica  Cervicalgia  Muscle weakness (generalized)     Problem List Patient Active Problem List   Diagnosis Date Noted  . Right-sided thoracic back pain 12/14/2015  . Numbness  and tingling of right arm 12/14/2015  . Neck pain 12/14/2015  . Low back pain 12/14/2015  . Vertigo 04/20/2015  . History of chronic urinary tract infection 03/31/2014  . Urgency of urination 12/18/2013  . Urethritis 12/18/2013  . Capsulitis of foot 02/04/2013  . Sesamoiditis 02/04/2013  . Allergic rhinitis   . Osteopenia   . THYROID NODULE 03/23/2010  . Protein S deficiency ([place]) 03/23/2010  . ECZEMA 03/23/2010    Carney Living PT DPT  02/03/2016, 3:25 PM  [facility] [address] [place], [place], [address] Phone: [phone]   Fax:  [phone]  Name: Veronica Chen MRN: [id number] Date of Birth: Jan 31, 1966

## 2016-02-08 ENCOUNTER — Ambulatory Visit: Payer: 59 | Admitting: Physical Therapy

## 2016-02-08 DIAGNOSIS — M545 Low back pain, unspecified: Secondary | ICD-10-CM

## 2016-02-08 DIAGNOSIS — M542 Cervicalgia: Secondary | ICD-10-CM

## 2016-02-08 DIAGNOSIS — M6281 Muscle weakness (generalized): Secondary | ICD-10-CM

## 2016-02-08 NOTE — Therapy (Signed)
[place], [place], [address] Phone: [phone]   Fax:  [phone]  Physical Therapy Treatment  Patient Details  Name: Veronica Chen MRN: [id number] Date of Birth: 1965-10-25 Referring Provider: Billey Gosling, MD  Encounter Date: 02/08/2016      PT End of Session - 02/08/16 0913    Visit Number 4   Number of Visits 16   Date for PT Re-Evaluation 03/27/16   PT Start Time 0804   PT Stop Time 0850   PT Time Calculation (min) 46 min   Activity Tolerance Patient tolerated treatment well   Behavior During Therapy [facility] for tasks assessed/performed      Past Medical History:  Diagnosis Date  . 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 ECZEMA   . Palpitations   . Protein S deficiency ([place])   . PVC's (premature ventricular contractions)   . THYROID NODULE   . Vertigo     Past Surgical History:  Procedure Laterality Date  . CESAREAN SECTION     x2  . uterine ablation      There were no vitals filed for this visit.      Subjective Assessment - 02/08/16 0806    Subjective Lt neck hurts.  Gets dull HA intermittantly. No HA at all last week. Back loosens by the end of the day.    Currently in Pain? Yes   Pain Score --  6-7/ am to 3-4 at end of day.    Pain Location Neck   Pain Orientation Left   Pain Descriptors / Indicators Shooting   Pain Radiating Towards ear, down back, to shoulder   Pain Frequency Constant   Aggravating Factors  turning head, hair care,                          [facility] Adult PT Treatment/Exercise - 02/08/16 0001      Self-Care   Self-Care ADL's;Posture  practiced sitting,  verbally reviewed others.  Foot support    Posture --  discussed the importance of pain control, pain cycle     Neck Exercises: Seated   Other Seated Exercise shoulder retraction 5 X to assist posture.     Manual Therapy   Manual  Therapy Soft tissue mobilization   Manual therapy comments instrument assist and hands used for soft tissue work.  Light pressure only .  It is hard for patient to relax. manual to reduce edema triceps and lateral neck with lymph system vacume activation.  .  Taping X over rhomboids, both to create space, I strin mid scapula to create space left, @ I strips left neck to assist with lateral flexion.  Less pain post manual.                 PT Education - 02/08/16 0902    Education provided Yes   Education Details Posture ed, sitting, handout for ADL's.  Pain cycle   Person(s) Educated Patient   Methods Explanation;Demonstration   Comprehension Verbalized understanding          PT Short Term Goals - 02/08/16 0919      PT SHORT TERM GOAL #1   Title Pt will be independent in her HEP in order to improve functional mobility and decrease pain.    Baseline She says she is independent in exercises so far.   Time 4  Period Weeks   Status On-going     PT SHORT TERM GOAL #2   Title Pt will improve her cervical rotation by 5 degrees both directions with pain less than 4/10 in order to improve driving safety with lane changes.    Time 4   Period Weeks   Status Unable to assess           PT Long Term Goals - 01/27/16 0950      PT LONG TERM GOAL #1   Title Pt will be able to improve her FOTO score from 55% limitation to </= 40% limitation.    Baseline 55% limitation on 01/26/16   Time 8   Period Weeks   Status New     PT LONG TERM GOAL #2   Title Pt will be able to donn her shoes with no pain reported in a sitting position.    Baseline unable to put on her shoes without assistance or pain increasing to 7-8/10.    Time 8   Period Weeks   Status New     PT LONG TERM GOAL #3   Title Pt will be perform head turns to bilateral directions with pain less than 2/10 and no LOB or dizziness noted.    Time 8   Period Weeks   Status New     PT LONG TERM GOAL #4   Title Pt will  be able to tolerate sitting for greater than/= 60 minutes.    Time 8   Period Weeks   Status New     PT LONG TERM GOAL #5   Title Pt will be able to retrieve an object weighting 10 # off the floor using correct body mechanics.    Time 8   Period Weeks   Status New     Additional Long Term Goals   Additional Long Term Goals Yes     PT LONG TERM GOAL #6   Title Pt will be able to return to using her total gym and ellipiticl pain free.    Baseline causes incresed pain    Time 8   Period Weeks   Status New               Plan - 02/08/16 0913    Clinical Impression Statement Education and manual were the focus of today's session.  Light pressure only for soft tissue, she was tense for a lot of session.  Taping seemed to make lateral flexion mor comfortable and help her upper back feel supported.   Foot support and lumbar support were able to decrease her low back pain.    PT Next Visit Plan Measure Neck ROM, assess tape.  Re tape if helpful, Santiago Glad will help)  Check to see if husband made a foot rest for work/home.  Light core strengthening.   PT Home Exercise Plan Continue to focus on decreasing muscle spasm and improvingposture. 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 Add light core stregthening. Patient given posture exercises with core stabilization.    Consulted and Agree with Plan of Care Patient      Patient will benefit from skilled therapeutic intervention in order to improve the following deficits and impairments:  Decreased range of motion, Difficulty walking, Impaired UE functional use, Decreased activity tolerance, Impaired perceived functional ability, Pain, Postural dysfunction, Decreased mobility, Decreased strength, Improper body mechanics  Visit Diagnosis: Acute midline low back pain without sciatica  Cervicalgia  Muscle weakness (generalized)  Problem List Patient Active Problem List   Diagnosis Date Noted  . Right-sided thoracic back pain  12/14/2015  . Numbness and tingling of right arm 12/14/2015  . Neck pain 12/14/2015  . Low back pain 12/14/2015  . Vertigo 04/20/2015  . History of chronic urinary tract infection 03/31/2014  . Urgency of urination 12/18/2013  . Urethritis 12/18/2013  . Capsulitis of foot 02/04/2013  . Sesamoiditis 02/04/2013  . Allergic rhinitis   . Osteopenia   . THYROID NODULE 03/23/2010  . Protein S deficiency ([place]) 03/23/2010  . ECZEMA 03/23/2010    Hardin Hardenbrook PTA 02/08/2016, 9:21 AM  [facility] [address] [place], [place], [address] Phone: [phone]   Fax:  [phone]  Name: Veronica Chen MRN: [id number] Date of Birth: 25-May-1965

## 2016-02-08 NOTE — Patient Instructions (Signed)

## 2016-02-10 ENCOUNTER — Ambulatory Visit: Payer: 59 | Admitting: Physical Therapy

## 2016-02-15 ENCOUNTER — Ambulatory Visit: Payer: 59 | Admitting: Physical Therapy

## 2016-02-17 ENCOUNTER — Ambulatory Visit: Payer: 59 | Admitting: Physical Therapy

## 2016-02-22 ENCOUNTER — Ambulatory Visit: Payer: 59 | Admitting: Physical Therapy

## 2016-02-22 NOTE — Therapy (Addendum)
[place], [place], [address] Phone: [phone]   Fax:  [phone]  Physical Therapy Treatment  Patient Details  Name: Veronica Chen MRN: [id number] Date of Birth: 09/05/1965 Referring Provider: Billey Gosling, MD  Encounter Date: 02/22/2016      PT End of Session - 02/22/16 0844    PT Start Time 0803   PT Stop Time 0815   PT Time Calculation (min) 12 min   Behavior During Therapy [facility] for tasks assessed/performed      Past Medical History:  Diagnosis Date  . 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 ECZEMA   . Palpitations   . Protein S deficiency ([place])   . PVC's (premature ventricular contractions)   . THYROID NODULE   . Vertigo     Past Surgical History:  Procedure Laterality Date  . CESAREAN SECTION     x2  . uterine ablation      There were no vitals filed for this visit.      Subjective Assessment - 02/22/16 0824    Subjective Pain is getting better.  I feel I can exercise at home.  I am ready to be discharged.   know how to do all the exercises.  I can't believe it takes so long for the pain to go.   Currently in Pain? Yes   Pain Score 4   4/10 now.  in the morning it gets up to 8-9/10   Pain Location Back   Pain Orientation Lower   Pain Descriptors / Indicators --  stiff pain   Pain Type Acute pain   Aggravating Factors  AM when I first get up.   Worse at the end of her work day.,  donning shoes.   Pain Relieving Factors Yoga stretches,  exercises.  foot rest   Effect of Pain on Daily Activities Moves slow for ADL's and dression   Pain Score 5   Pain Location Neck   Pain Orientation Left   Pain Descriptors / Indicators Shooting   Pain Type Acute pain   Pain Radiating Towards ear to shoulder   Pain Frequency Intermittent   Aggravating Factors  turning head,  desk work,  sitting more than 45 minutes.   Pain Relieving Factors  exercises,  modified sitting posture            [facility] PT Assessment - 02/22/16 0001      Observation/Other Assessments   Focus on Therapeutic Outcomes (FOTO)  41% limitation     AROM   Cervical - Right Rotation 50   Cervical - Left Rotation 45                               PT Short Term Goals - 02/22/16 7782      PT SHORT TERM GOAL #1   Title Pt will be independent in her HEP in order to improve functional mobility and decrease pain.    Baseline independent   Time 4   Period Weeks   Status Achieved     PT SHORT TERM GOAL #2   Title Pt will improve her cervical rotation by 5 degrees both directions with pain less than 4/10 in order to improve driving safety with lane changes.    Baseline R = 50, L = 45   Time 4   Period Weeks  Status Not Met           PT Long Term Goals - 02/22/16 8309      PT LONG TERM GOAL #1   Title Pt will be able to improve her FOTO score from 55% limitation to </= 40% limitation.    Baseline 41% limitation   Time 8   Period Weeks   Status Partially Met     PT LONG TERM GOAL #2   Title Pt will be able to donn her shoes with no pain reported in a sitting position.    Baseline painful 7-8/10   Time 8   Period Weeks   Status Not Met     PT LONG TERM GOAL #3   Title Pt will be perform head turns to bilateral directions with pain less than 2/10 and no LOB or dizziness noted.    Baseline able to turn head with no pain and no LOB or dizziness if she is careful   Time 8   Period Weeks   Status Achieved     PT LONG TERM GOAL #4   Title Pt will be able to tolerate sitting for greater than/= 60 minutes.    Baseline 45 minutes tolerated   Time 8   Period Weeks   Status Not Met     PT LONG TERM GOAL #5   Title Pt will be able to retrieve an object weighting 10 # off the floor using correct body mechanics.    Time 8   Period Weeks   Status Unable to assess     PT LONG TERM GOAL #6   Title Pt will be able to  return to using her total gym and ellipiticl pain free.    Baseline Back to using elliptical.  Back to part of her total gym routine.   Period Weeks   Status Partially Met               Plan - 02/22/16 4076    Clinical Impression Statement Patient does not want PT anymore.  She wants her appointment slot to go to someone who really needs PT.  Pain continues in neck and back but ease with exercises.  She has been able to return to part of her home exercise routine with total gym and elliptical.  Pain in low back gets up to 9/10.  Her ROM neck rotation is slightly less, however she can rotate with no pain.  STG # 1 met,  STG#2 not met.LTG#3 met,  LTG's # 1, #6 partly met, LTG# 2, #4 not met,, LTG#4 not assessed. FOTO : 41 % limitation.   PT Next Visit Plan Discharge at patient's request.  She feels she can exercise on her own.    PT Home Exercise Plan posture exercises with core stabilization.   Consulted and Agree with Plan of Care Patient      PHYSICAL THERAPY DISCHARGE SUMMARY  Visits from Start of Care: 4  Current functional level related to goals / functional outcomes: Improved but still has motion limitations and some spasming    Remaining deficits: Motion deficits but patient feels like she can work on it on her own.    Education / Equipment: HEP given  Plan: Patient agrees to discharge.  Patient goals were partially met. Patient is being discharged due to being pleased with the current functional level.  ?????      Patient will benefit from skilled therapeutic intervention in order to improve the following deficits and  impairments:  Decreased range of motion, Difficulty walking, Impaired UE functional use, Decreased activity tolerance, Impaired perceived functional ability, Pain, Postural dysfunction, Decreased mobility, Decreased strength, Improper body mechanics  Visit Diagnosis: Acute midline low back pain without sciatica  Cervicalgia  Muscle weakness  (generalized)     Problem List Patient Active Problem List   Diagnosis Date Noted  . Right-sided thoracic back pain 12/14/2015  . Numbness and tingling of right arm 12/14/2015  . Neck pain 12/14/2015  . Low back pain 12/14/2015  . Vertigo 04/20/2015  . History of chronic urinary tract infection 03/31/2014  . Urgency of urination 12/18/2013  . Urethritis 12/18/2013  . Capsulitis of foot 02/04/2013  . Sesamoiditis 02/04/2013  . Allergic rhinitis   . Osteopenia   . THYROID NODULE 03/23/2010  . Protein S deficiency ([place]) 03/23/2010  . ECZEMA 03/23/2010    Kinga Cassar PTA 02/22/2016, 8:45 AM  [facility] [address] [place], [place], [address] Phone: [phone]   Fax:  [phone]  Name: Veronica Chen MRN: [id number] Date of Birth: 1966/03/04

## 2016-02-24 ENCOUNTER — Ambulatory Visit: Payer: 59 | Admitting: Physical Therapy

## 2016-02-29 ENCOUNTER — Ambulatory Visit: Payer: 59 | Admitting: Physical Therapy

## 2016-03-02 ENCOUNTER — Encounter: Payer: 59 | Admitting: Physical Therapy

## 2016-03-07 ENCOUNTER — Encounter: Payer: 59 | Admitting: Physical Therapy

## 2016-03-09 ENCOUNTER — Encounter: Payer: 59 | Admitting: Physical Therapy

## 2016-03-14 ENCOUNTER — Encounter: Payer: 59 | Admitting: Physical Therapy

## 2016-04-04 ENCOUNTER — Ambulatory Visit (INDEPENDENT_AMBULATORY_CARE_PROVIDER_SITE_OTHER): Payer: 59 | Admitting: Nurse Practitioner

## 2016-04-04 ENCOUNTER — Encounter: Payer: Self-pay | Admitting: Nurse Practitioner

## 2016-04-04 VITALS — BP 110/76 | HR 77 | Temp 97.7°F | Ht 60.0 in | Wt 137.0 lb

## 2016-04-04 DIAGNOSIS — H6983 Other specified disorders of Eustachian tube, bilateral: Secondary | ICD-10-CM

## 2016-04-04 DIAGNOSIS — Z23 Encounter for immunization: Secondary | ICD-10-CM | POA: Diagnosis not present

## 2016-04-04 DIAGNOSIS — H6122 Impacted cerumen, left ear: Secondary | ICD-10-CM | POA: Diagnosis not present

## 2016-04-04 MED ORDER — CETIRIZINE HCL 10 MG PO TABS: 10.0000 mg | ORAL_TABLET | Freq: Every day | ORAL | 0 refills | Status: DC

## 2016-04-04 NOTE — Progress Notes (Signed)
Pre visit review using our clinic review tool, if applicable. No additional management support is needed unless otherwise documented below in the visit note. 
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
## 2016-08-28 ENCOUNTER — Ambulatory Visit (INDEPENDENT_AMBULATORY_CARE_PROVIDER_SITE_OTHER): Payer: 59 | Admitting: Internal Medicine

## 2016-08-28 ENCOUNTER — Encounter: Payer: Self-pay | Admitting: Internal Medicine

## 2016-08-28 VITALS — BP 120/82 | HR 75 | Temp 98.2°F | Resp 16 | Ht 60.0 in | Wt 133.1 lb

## 2016-08-28 DIAGNOSIS — H6122 Impacted cerumen, left ear: Secondary | ICD-10-CM

## 2016-08-28 NOTE — Progress Notes (Signed)
Pre visit review using our clinic review tool, if applicable. No additional management support is needed unless otherwise documented below in the visit note. 
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
## 2016-08-29 NOTE — Patient Instructions (Signed)
Earwax Buildup Your ears make a substance called earwax. It may also be called cerumen. Sometimes, too much earwax builds up in your ear canal. This can cause ear pain and make it harder for you to hear. CAUSES This condition is caused by too much earwax production or buildup. RISK FACTORS The following factors may make you more likely to develop this condition:  Cleaning your ears often with swabs.  Having narrow ear canals.  Having earwax that is overly thick or sticky.  Having eczema.  Being dehydrated. SYMPTOMS Symptoms of this condition include:  Reduced hearing.  Ear drainage.  Ear pain.  Ear itch.  A feeling of fullness in the ear or feeling that the ear is plugged.  Ringing in the ear.  Coughing. DIAGNOSIS Your health care provider can diagnose this condition based on your symptoms and medical history. Your health care provider will also do an ear exam to look inside your ear with a scope (otoscope). You may also have a hearing test. TREATMENT Treatment for this condition includes:  Over-the-counter or prescription ear drops to soften the earwax.  Earwax removal by a health care provider. This may be done:  By flushing the ear with body-temperature water.  With a medical instrument that has a loop at the end (earwax curette).  With a suction device. HOME CARE INSTRUCTIONS  Take over-the-counter and prescription medicines only as told by your health care provider.  Do not put any objects, including an ear swab, into your ear. You can clean the opening of your ear canal with a washcloth.  Drink enough water to keep your urine clear or pale yellow.  If you have frequent earwax buildup or you use hearing aids, consider seeing your health care provider every 6-12 months for routine preventive ear cleanings. Keep all follow-up visits as told by your health care provider. SEEK MEDICAL CARE IF:  You have ear pain.  Your condition does not improve with  treatment.  You have hearing loss.  You have blood, pus, or other fluid coming from your ear. This information is not intended to replace advice given to you by your health care provider. Make sure you discuss any questions you have with your health care provider. Document Released: 05/18/2004 Document Revised: 08/02/2015 Document Reviewed: 11/25/2014 Elsevier Interactive Patient Education  2017 [employer].

## 2016-10-12 ENCOUNTER — Ambulatory Visit (INDEPENDENT_AMBULATORY_CARE_PROVIDER_SITE_OTHER): Payer: 59 | Admitting: Internal Medicine

## 2016-10-12 ENCOUNTER — Encounter: Payer: Self-pay | Admitting: Internal Medicine

## 2016-10-12 VITALS — BP 114/74 | HR 81 | Temp 98.5°F | Resp 16 | Wt 133.0 lb

## 2016-10-12 DIAGNOSIS — H9192 Unspecified hearing loss, left ear: Secondary | ICD-10-CM | POA: Diagnosis not present

## 2016-10-12 DIAGNOSIS — H6982 Other specified disorders of Eustachian tube, left ear: Secondary | ICD-10-CM | POA: Diagnosis not present

## 2016-10-12 MED ORDER — CEFUROXIME AXETIL 500 MG PO TABS: ORAL_TABLET | ORAL | 0 refills | Status: DC

## 2016-10-12 NOTE — Assessment & Plan Note (Signed)
Her symptoms are consistent with ETD Continue sudafed - can use more since BP is very good Try switch nasal spray  Referred to ENT

## 2016-10-12 NOTE — Assessment & Plan Note (Signed)
Subjective hearing loss Will refer to ENT

## 2016-10-12 NOTE — Patient Instructions (Signed)
A referral was ordered for ENT.   

## 2016-10-12 NOTE — Progress Notes (Signed)
Subjective:    Patient ID: Veronica Chen, female    DOB: 02/01/1966, 51 y.o.   MRN: [id number]  HPI She is here for an acute visit.   For at least one year her left ear has not felt normal.  She knows she has decreased hearing in that hear.  The ear feels like it is constantly draining.  It feels like something is in it.  She has had it cleaned out a few times this year, but it does not feel like wax.  She uses flonase daily and it helps a little.  She has taken sudafed and that helps, but she did not think she should take it often.  She uses a topical ear drop on a q-tip to soothe the ear canal and keep the wax down.    Her right ear feels normal.   Medications and allergies reviewed with patient and updated if appropriate.  Patient Active Problem List   Diagnosis Date Noted  . Hearing loss due to cerumen impaction, left 08/28/2016  . Right-sided thoracic back pain 12/14/2015  . Numbness and tingling of right arm 12/14/2015  . Neck pain 12/14/2015  . Low back pain 12/14/2015  . Vertigo 04/20/2015  . History of chronic urinary tract infection 03/31/2014  . Urgency of urination 12/18/2013  . Urethritis 12/18/2013  . Capsulitis of foot 02/04/2013  . Sesamoiditis 02/04/2013  . Allergic rhinitis   . Osteopenia   . THYROID NODULE 03/23/2010  . Protein S deficiency ([place]) 03/23/2010  . ECZEMA 03/23/2010    Current Outpatient Prescriptions on File Prior to Visit  Medication Sig Dispense Refill  . aspirin 81 MG tablet Take 81 mg by mouth daily.    Marland Kitchen b complex vitamins tablet Take 1 tablet by mouth daily.    . cetirizine (ZYRTEC) 10 MG tablet Take 1 tablet (10 mg total) by mouth daily. 30 tablet 0  . cyclobenzaprine (FLEXERIL) 5 MG tablet TAKE 1 TABLET (5 MG TOTAL) BY MOUTH AT BEDTIME. 30 tablet 0  . fluticasone (FLONASE) 50 MCG/ACT nasal spray Place 2 sprays into both nostrils daily. 16 g 6  . MAGNESIUM PO Take by mouth.    Marland Kitchen VITAMIN E PO Take by mouth.     No current  facility-administered medications on file prior to visit.     Past Medical History:  Diagnosis Date  . 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 ECZEMA   . Palpitations   . Protein S deficiency ([place])   . PVC's (premature ventricular contractions)   . THYROID NODULE   . Vertigo     Past Surgical History:  Procedure Laterality Date  . CESAREAN SECTION     x2  . uterine ablation      Social History   Social History  . Marital status: Married    Spouse name: N/A  . Number of children: 2  . Years of education: college 2   Occupational History  . [employer]   Social History Main Topics  . Smoking status: Never Smoker  . 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 Alcohol use No  . Drug use: No  . Sexual activity: Not on file   Other Topics Concern  . Not on file   Social History Narrative   Patient lives at home with husband Veronica Chen  Patient has 2 children.    Patient has a college education.    Patient is right handed.     Family History  Problem Relation Age of Onset  . Heart attack Unknown   . Other Mother        parathyroid  . Clotting disorder Father     Review of Systems  Constitutional: Negative for chills and fever.  HENT: Positive for hearing loss. Negative for ear pain, sinus pain and sinus pressure.   Neurological: Negative for dizziness, light-headedness and headaches.       Objective:   Vitals:   10/12/16 0946  BP: 114/74  Pulse: 81  Resp: 16  Temp: 98.5 F (36.9 C)   Filed Weights   10/12/16 0946  Weight: 133 lb (60.3 kg)   Body mass index is 25.97 kg/m.  Wt Readings from Last 3 Encounters:  10/12/16 133 lb (60.3 kg)  08/28/16 133 lb 2 oz (60.4 kg)  04/04/16 137 lb (62.1 kg)     Physical Exam GENERAL APPEARANCE: Appears stated age, well appearing, NAD EYES: conjunctiva clear, no  icterus HEENT: bilateral tympanic membranes and ear canals normal, oropharynx with no erythema, no thyromegaly, trachea midline, no cervical or supraclavicular lymphadenopathy Skin: Skin is warm and dry. Not diaphoretic.        Assessment & Plan:   See Problem List for Assessment and Plan of chronic medical problems.

## 2016-11-30 DIAGNOSIS — H903 Sensorineural hearing loss, bilateral: Secondary | ICD-10-CM | POA: Insufficient documentation

## 2016-11-30 DIAGNOSIS — L299 Pruritus, unspecified: Secondary | ICD-10-CM | POA: Diagnosis not present

## 2016-11-30 DIAGNOSIS — H6122 Impacted cerumen, left ear: Secondary | ICD-10-CM | POA: Diagnosis not present

## 2017-01-16 DIAGNOSIS — L400 Psoriasis vulgaris: Secondary | ICD-10-CM | POA: Diagnosis not present

## 2017-01-16 DIAGNOSIS — D225 Melanocytic nevi of trunk: Secondary | ICD-10-CM | POA: Diagnosis not present

## 2017-01-16 DIAGNOSIS — D2261 Melanocytic nevi of right upper limb, including shoulder: Secondary | ICD-10-CM | POA: Diagnosis not present

## 2017-04-05 DIAGNOSIS — Z01419 Encounter for gynecological examination (general) (routine) without abnormal findings: Secondary | ICD-10-CM | POA: Diagnosis not present

## 2017-04-05 DIAGNOSIS — H6123 Impacted cerumen, bilateral: Secondary | ICD-10-CM | POA: Diagnosis not present

## 2017-04-05 LAB — HM PAP SMEAR: HM PAP: NEGATIVE

## 2017-04-06 LAB — HM MAMMOGRAPHY

## 2017-04-18 DIAGNOSIS — H5213 Myopia, bilateral: Secondary | ICD-10-CM | POA: Diagnosis not present

## 2017-04-25 ENCOUNTER — Ambulatory Visit (INDEPENDENT_AMBULATORY_CARE_PROVIDER_SITE_OTHER): Payer: 59 | Admitting: *Deleted

## 2017-04-25 DIAGNOSIS — Z23 Encounter for immunization: Secondary | ICD-10-CM

## 2017-05-28 DIAGNOSIS — H6122 Impacted cerumen, left ear: Secondary | ICD-10-CM | POA: Diagnosis not present

## 2017-05-28 DIAGNOSIS — L299 Pruritus, unspecified: Secondary | ICD-10-CM | POA: Diagnosis not present

## 2017-06-27 ENCOUNTER — Telehealth: Payer: Self-pay | Admitting: Emergency Medicine

## 2017-06-27 NOTE — Telephone Encounter (Signed)
Insurance will cover, will order.

## 2017-06-27 NOTE — Telephone Encounter (Signed)
Copied from [place] [id number]. Topic: Inquiry >> Jun 26, 2017  4:50 PM Veronica Chen wrote: Reason for CRM: pt would like to get a cologuard screening mailed to her, contact pt to advise

## 2017-06-27 NOTE — Telephone Encounter (Signed)
Spoke with pt, she is going to verify that her insurance will cover it and then contact us and let us know.

## 2017-07-03 NOTE — Telephone Encounter (Signed)
Cologuard has been ordered. 

## 2017-08-20 DIAGNOSIS — Z1211 Encounter for screening for malignant neoplasm of colon: Secondary | ICD-10-CM | POA: Diagnosis not present

## 2017-08-20 DIAGNOSIS — Z1212 Encounter for screening for malignant neoplasm of rectum: Secondary | ICD-10-CM | POA: Diagnosis not present

## 2017-08-22 LAB — COLOGUARD: Cologuard: NEGATIVE

## 2017-09-27 ENCOUNTER — Encounter: Payer: Self-pay | Admitting: Internal Medicine

## 2017-11-12 ENCOUNTER — Telehealth: Payer: Self-pay | Admitting: Emergency Medicine

## 2017-11-12 NOTE — Telephone Encounter (Signed)
Copied from [place] [id number]. Topic: Quick Communication - Other Results >> Nov 09, 2017 11:38 AM Lennox Solders wrote: Pt is calling and would like her cologuard result

## 2017-11-12 NOTE — Telephone Encounter (Signed)
Per Portal, test- negative. Spoke with pt to inform.

## 2017-11-27 DIAGNOSIS — H6122 Impacted cerumen, left ear: Secondary | ICD-10-CM | POA: Diagnosis not present

## 2017-12-25 ENCOUNTER — Encounter: Payer: Self-pay | Admitting: Internal Medicine

## 2017-12-25 NOTE — Patient Instructions (Addendum)
Take zantac or pepcid daily.   Test(s) ordered today. Your results will be released to [place] (or called to you) after review, usually within 72hours after test completion. If any changes need to be made, you will be notified at that same time.  All other Health Maintenance issues reviewed.   All recommended immunizations and age-appropriate screenings are up-to-date or discussed.  Flu and tetanus immunizations administered today.   Medications reviewed and updated.  Changes include trying gabapentin at night as needed for sleep or sciatica pain.  Your prescription(s) have been submitted to your pharmacy. Please take as directed and contact our office if you believe you are having problem(s) with the medication(s).   Please followup in one year    Health Maintenance, Female Adopting a healthy lifestyle and getting preventive care can go a long way to promote health and wellness. Talk with your health care provider about what schedule of regular examinations is right for you. This is a good chance for you to check in with your provider about disease prevention and staying healthy. In between checkups, there are plenty of things you can do on your own. Experts have done a lot of research about which lifestyle changes and preventive measures are most likely to keep you healthy. Ask your health care provider for more information. Weight and diet Eat a healthy diet  Be sure to include plenty of vegetables, fruits, low-fat dairy products, and lean protein.  Do not eat a lot of foods high in solid fats, added sugars, or salt.  Get regular exercise. This is one of the most important things you can do for your health. ? Most adults should exercise for at least 150 minutes each week. The exercise should increase your heart rate and make you sweat (moderate-intensity exercise). ? Most adults should also do strengthening exercises at least twice a week. This is in addition to the  moderate-intensity exercise.  Maintain a healthy weight  Body mass index (BMI) is a measurement that can be used to identify possible weight problems. It estimates body fat based on height and weight. Your health care provider can help determine your BMI and help you achieve or maintain a healthy weight.  For females 43 years of age and older: ? A BMI below 18.5 is considered underweight. ? A BMI of 18.5 to 24.9 is normal. ? A BMI of 25 to 29.9 is considered overweight. ? A BMI of 30 and above is considered obese.  Watch levels of cholesterol and blood lipids  You should start having your blood tested for lipids and cholesterol at 51 years of age, then have this test every 5 years.  You may need to have your cholesterol levels checked more often if: ? Your lipid or cholesterol levels are high. ? You are older than 51 years of age. ? You are at high risk for heart disease.  Cancer screening Lung Cancer  Lung cancer screening is recommended for adults 55-31 years old who are at high risk for lung cancer because of a history of smoking.  A yearly low-dose CT scan of the lungs is recommended for people who: ? Currently smoke. ? Have quit within the past 15 years. ? Have at least a 30-pack-year history of smoking. A pack year is smoking an average of one pack of cigarettes a day for 1 year.  Yearly screening should continue until it has been 15 years since you quit.  Yearly screening should stop if you develop a health  problem that would prevent you from having lung cancer treatment.  Breast Cancer  Practice breast self-awareness. This means understanding how your breasts normally appear and feel.  It also means doing regular breast self-exams. Let your health care provider know about any changes, no matter how small.  If you are in your 20s or 30s, you should have a clinical breast exam (CBE) by a health care provider every 1-3 years as part of a regular health exam.  If you  are 66 or older, have a CBE every year. Also consider having a breast X-ray (mammogram) every year.  If you have a family history of breast cancer, talk to your health care provider about genetic screening.  If you are at high risk for breast cancer, talk to your health care provider about having an MRI and a mammogram every year.  Breast cancer gene (BRCA) assessment is recommended for women who have family members with BRCA-related cancers. BRCA-related cancers include: ? Breast. ? Ovarian. ? Tubal. ? Peritoneal cancers.  Results of the assessment will determine the need for genetic counseling and BRCA1 and BRCA2 testing.  Cervical Cancer Your health care provider may recommend that you be screened regularly for cancer of the pelvic organs (ovaries, uterus, and vagina). This screening involves a pelvic examination, including checking for microscopic changes to the surface of your cervix (Pap test). You may be encouraged to have this screening done every 3 years, beginning at age 109.  For women ages 24-65, health care providers may recommend pelvic exams and Pap testing every 3 years, or they may recommend the Pap and pelvic exam, combined with testing for human papilloma virus (HPV), every 5 years. Some types of HPV increase your risk of cervical cancer. Testing for HPV may also be done on women of any age with unclear Pap test results.  Other health care providers may not recommend any screening for nonpregnant women who are considered low risk for pelvic cancer and who do not have symptoms. Ask your health care provider if a screening pelvic exam is right for you.  If you have had past treatment for cervical cancer or a condition that could lead to cancer, you need Pap tests and screening for cancer for at least 20 years after your treatment. If Pap tests have been discontinued, your risk factors (such as having a new sexual partner) need to be reassessed to determine if screening should  resume. Some women have medical problems that increase the chance of getting cervical cancer. In these cases, your health care provider may recommend more frequent screening and Pap tests.  Colorectal Cancer  This type of cancer can be detected and often prevented.  Routine colorectal cancer screening usually begins at 51 years of age and continues through 51 years of age.  Your health care provider may recommend screening at an earlier age if you have risk factors for colon cancer.  Your health care provider may also recommend using home test kits to check for hidden blood in the stool.  A small camera at the end of a tube can be used to examine your colon directly (sigmoidoscopy or colonoscopy). This is done to check for the earliest forms of colorectal cancer.  Routine screening usually begins at age 92.  Direct examination of the colon should be repeated every 5-10 years through 51 years of age. However, you may need to be screened more often if early forms of precancerous polyps or small growths are found.  Skin Cancer  Check your skin from head to toe regularly.  Tell your health care provider about any new moles or changes in moles, especially if there is a change in a mole's shape or color.  Also tell your health care provider if you have a mole that is larger than the size of a pencil eraser.  Always use sunscreen. Apply sunscreen liberally and repeatedly throughout the day.  Protect yourself by wearing long sleeves, pants, a wide-brimmed hat, and sunglasses whenever you are outside.  Heart disease, diabetes, and high blood pressure  High blood pressure causes heart disease and increases the risk of stroke. High blood pressure is more likely to develop in: ? People who have blood pressure in the high end of the normal range (130-139/85-89 mm Hg). ? People who are overweight or obese. ? People who are African American.  If you are 16-30 years of age, have your blood  pressure checked every 3-5 years. If you are 13 years of age or older, have your blood pressure checked every year. You should have your blood pressure measured twice-once when you are at a hospital or clinic, and once when you are not at a hospital or clinic. Record the average of the two measurements. To check your blood pressure when you are not at a hospital or clinic, you can use: ? An automated blood pressure machine at a pharmacy. ? A home blood pressure monitor.  If you are between 81 years and 24 years old, ask your health care provider if you should take aspirin to prevent strokes.  Have regular diabetes screenings. This involves taking a blood sample to check your fasting blood sugar level. ? If you are at a normal weight and have a low risk for diabetes, have this test once every three years after 51 years of age. ? If you are overweight and have a high risk for diabetes, consider being tested at a younger age or more often. Preventing infection Hepatitis B  If you have a higher risk for hepatitis B, you should be screened for this virus. You are considered at high risk for hepatitis B if: ? You were born in a country where hepatitis B is common. Ask your health care provider which countries are considered high risk. ? Your parents were born in a high-risk country, and you have not been immunized against hepatitis B (hepatitis B vaccine). ? You have HIV or AIDS. ? You use needles to inject street drugs. ? You live with someone who has hepatitis B. ? You have had sex with someone who has hepatitis B. ? You get hemodialysis treatment. ? You take certain medicines for conditions, including cancer, organ transplantation, and autoimmune conditions.  Hepatitis C  Blood testing is recommended for: ? Everyone born from [birth year] through 1964. ? Anyone with known risk factors for hepatitis C.  Sexually transmitted infections (STIs)  You should be screened for sexually transmitted  infections (STIs) including gonorrhea and chlamydia if: ? You are sexually active and are younger than 51 years of age. ? You are older than 51 years of age and your health care provider tells you that you are at risk for this type of infection. ? Your sexual activity has changed since you were last screened and you are at an increased risk for chlamydia or gonorrhea. Ask your health care provider if you are at risk.  If you do not have HIV, but are at risk, it may be recommended that you take a prescription  medicine daily to prevent HIV infection. This is called pre-exposure prophylaxis (PrEP). You are considered at risk if: ? You are sexually active and do not regularly use condoms or know the HIV status of your partner(s). ? You take drugs by injection. ? You are sexually active with a partner who has HIV.  Talk with your health care provider about whether you are at high risk of being infected with HIV. If you choose to begin PrEP, you should first be tested for HIV. You should then be tested every 3 months for as long as you are taking PrEP. Pregnancy  If you are premenopausal and you may become pregnant, ask your health care provider about preconception counseling.  If you may become pregnant, take 400 to 800 micrograms (mcg) of folic acid every day.  If you want to prevent pregnancy, talk to your health care provider about birth control (contraception). Osteoporosis and menopause  Osteoporosis is a disease in which the bones lose minerals and strength with aging. This can result in serious bone fractures. Your risk for osteoporosis can be identified using a bone density scan.  If you are 14 years of age or older, or if you are at risk for osteoporosis and fractures, ask your health care provider if you should be screened.  Ask your health care provider whether you should take a calcium or vitamin D supplement to lower your risk for osteoporosis.  Menopause may have certain physical  symptoms and risks.  Hormone replacement therapy may reduce some of these symptoms and risks. Talk to your health care provider about whether hormone replacement therapy is right for you. Follow these instructions at home:  Schedule regular health, dental, and eye exams.  Stay current with your immunizations.  Do not use any tobacco products including cigarettes, chewing tobacco, or electronic cigarettes.  If you are pregnant, do not drink alcohol.  If you are breastfeeding, limit how much and how often you drink alcohol.  Limit alcohol intake to no more than 1 drink per day for nonpregnant women. One drink equals 12 ounces of beer, 5 ounces of wine, or 1 ounces of hard liquor.  Do not use street drugs.  Do not share needles.  Ask your health care provider for help if you need support or information about quitting drugs.  Tell your health care provider if you often feel depressed.  Tell your health care provider if you have ever been abused or do not feel safe at home. This information is not intended to replace advice given to you by your health care provider. Make sure you discuss any questions you have with your health care provider. Document Released: 10/24/2010 Document Revised: 09/16/2015 Document Reviewed: 01/12/2015 Elsevier Interactive Patient Education  [employer].

## 2017-12-25 NOTE — Progress Notes (Signed)
Subjective:    Patient ID: Veronica Chen, female    DOB: Apr 20, 1966, 52 y.o.   MRN: [id number]  HPI She is here for a physical exam.   She wonders about getting blood test to show if she is in menopause or not.  She states her gynecologist has not done that.  She is having lighter periods and more significant cramping with her periods.  She is having difficulty sleeping and has had some hot flashes, but nothing significant.  She denies any variation in mood.  She has occasional palpitations.  1 of the things she is noticed most is that when she has her menses now she has a sciatica-like pains in the left buttocks down the left leg.  She also notices when she does a lot of walking.  The pain often goes away in between periods.   She is not sleeping well - sleeps for 3-4 hours then sleeps a little more later.  This started when her periods became "different".    Medications and allergies reviewed with patient and updated if appropriate.  Patient Active Problem List   Diagnosis Date Noted  . Sciatica of left side 12/27/2017  . Hearing loss of left ear 10/12/2016  . Right-sided thoracic back pain 12/14/2015  . Numbness and tingling of right arm 12/14/2015  . Neck pain 12/14/2015  . Low back pain 12/14/2015  . Vertigo 04/20/2015  . History of chronic urinary tract infection 03/31/2014  . Urgency of urination 12/18/2013  . Urethritis 12/18/2013  . Capsulitis of foot 02/04/2013  . Sesamoiditis 02/04/2013  . Allergic rhinitis   . Osteopenia   . THYROID NODULE 03/23/2010  . Protein S deficiency ([place]) 03/23/2010  . ECZEMA 03/23/2010    Current Outpatient Medications on File Prior to Visit  Medication Sig Dispense Refill  . aspirin 81 MG tablet Take 81 mg by mouth daily.    Marland Kitchen b complex vitamins tablet Take 1 tablet by mouth daily.    . cetirizine (ZYRTEC) 10 MG tablet Take 1 tablet (10 mg total) by mouth daily. 30 tablet 0  . fluticasone (FLONASE) 50 MCG/ACT nasal spray Place 2  sprays into both nostrils daily. 16 g 6  . MAGNESIUM PO Take by mouth.    Marland Kitchen VITAMIN E PO Take by mouth.     No current facility-administered medications on file prior to visit.     Past Medical History:  Diagnosis Date  . 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 ECZEMA   . Palpitations   . Protein S deficiency ([place])   . PVC's (premature ventricular contractions)   . THYROID NODULE   . Vertigo     Past Surgical History:  Procedure Laterality Date  . CESAREAN SECTION     x2  . uterine ablation      Social History   Socioeconomic History  . Marital status: Married    Spouse name: Not on file  . Number of children: 2  . Years of education: college 2  . Highest education level: Not on file  Occupational History  . 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 Financial resource strain: Not on file  . Food insecurity:    Worry: Not on file    Inability: Not on file  . Transportation needs:    Medical: Not on file    Non-medical: Not on file  Tobacco Use  .  Smoking status: Never Smoker  . Smokeless tobacco: Never Used  . 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 Alcohol use: No  . Drug use: No  . Sexual activity: Not on file  Lifestyle  . Physical activity:    Days per week: Not on file    Minutes per session: Not on file  . Stress: Not on file  Relationships  . Social connections:    Talks on phone: Not on file    Gets together: Not on file    Attends religious service: Not on file    Active member of club or organization: Not on file    Attends meetings of clubs or organizations: Not on file    Relationship status: Not on file  Other Topics Concern  . Not on file  Social History Narrative   Patient lives at home with husband [name]    Patient has 2 children.    Patient has a college education.    Patient  is right handed.     Family History  Problem Relation Age of Onset  . Other Mother        parathyroid  . Clotting disorder Father   . Heart attack Unknown     Review of Systems  Constitutional: Negative for chills and fever.  HENT: Positive for trouble swallowing (sometimes).   Eyes: Negative for visual disturbance.  Respiratory: Negative for cough, shortness of breath and wheezing.   Cardiovascular: Positive for palpitations (occ ). Negative for chest pain and leg swelling.  Gastrointestinal: Positive for nausea (with menses). Negative for abdominal pain, blood in stool, constipation and diarrhea.       Feels air in esophagus - burping, tums relieves  Genitourinary: Negative for dysuria and hematuria.  Musculoskeletal: Positive for back pain.  Skin: Negative for color change and rash.  Neurological: Negative for light-headedness and headaches.  Psychiatric/Behavioral: Positive for sleep disturbance. Negative for dysphoric mood. The patient is not nervous/anxious.        Objective:   Vitals:   12/27/17 0802  BP: 112/68  Pulse: 76  Resp: 16  Temp: 98.4 F (36.9 C)  SpO2: 98%   Filed Weights   12/27/17 0802  Weight: 134 lb (60.8 kg)   Body mass index is 26.17 kg/m.  Wt Readings from Last 3 Encounters:  12/27/17 134 lb (60.8 kg)  10/12/16 133 lb (60.3 kg)  08/28/16 133 lb 2 oz (60.4 kg)     Physical Exam Constitutional: She appears well-developed and well-nourished. No distress.  HENT:  Head: Normocephalic and atraumatic.  Right Ear: External ear normal. Normal ear canal and TM Left Ear: External ear normal.  Normal ear canal and TM Mouth/Throat: Oropharynx is clear and moist.  Eyes: Conjunctivae and EOM are normal.  Neck: Neck supple. No tracheal deviation present. No thyromegaly present.  No carotid bruit  Cardiovascular: Normal rate, regular rhythm and normal heart sounds.   No murmur heard.  No edema. Pulmonary/Chest: Effort normal and breath sounds  normal. No respiratory distress. She has no wheezes. She has no rales.  Breast: deferred to Gyn Abdominal: Soft. She exhibits no distension. There is no tenderness.  Lymphadenopathy: She has no cervical adenopathy.  Skin: Skin is warm and dry. She is not diaphoretic.  Psychiatric: She has a normal mood and affect. Her behavior is normal.        Assessment & Plan:   Physical exam: Screening blood  work ordered Immunizations  Flu vaccine today, discussed shingrix,  Td today Colonoscopy  - did cologuard Mammogram     Up to date  Gyn   Up to date  Eye exams   Up to date  EKG     Done 2013 Exercise  regular Weight  Normal BMI Skin   No concerns, sees derm annually Substance abuse     none  See Problem List for Assessment and Plan of chronic medical problems.   FU in one year

## 2017-12-27 ENCOUNTER — Other Ambulatory Visit (INDEPENDENT_AMBULATORY_CARE_PROVIDER_SITE_OTHER): Payer: 59

## 2017-12-27 ENCOUNTER — Encounter: Payer: Self-pay | Admitting: Internal Medicine

## 2017-12-27 ENCOUNTER — Ambulatory Visit (INDEPENDENT_AMBULATORY_CARE_PROVIDER_SITE_OTHER): Payer: 59 | Admitting: Internal Medicine

## 2017-12-27 VITALS — BP 112/68 | HR 76 | Temp 98.4°F | Resp 16 | Ht 60.0 in | Wt 134.0 lb

## 2017-12-27 DIAGNOSIS — Z0000 Encounter for general adult medical examination without abnormal findings: Secondary | ICD-10-CM

## 2017-12-27 DIAGNOSIS — K219 Gastro-esophageal reflux disease without esophagitis: Secondary | ICD-10-CM

## 2017-12-27 DIAGNOSIS — Z23 Encounter for immunization: Secondary | ICD-10-CM

## 2017-12-27 DIAGNOSIS — Z87440 Personal history of urinary (tract) infections: Secondary | ICD-10-CM

## 2017-12-27 DIAGNOSIS — M5432 Sciatica, left side: Secondary | ICD-10-CM

## 2017-12-27 DIAGNOSIS — D6859 Other primary thrombophilia: Secondary | ICD-10-CM

## 2017-12-27 DIAGNOSIS — M8580 Other specified disorders of bone density and structure, unspecified site: Secondary | ICD-10-CM

## 2017-12-27 LAB — COMPREHENSIVE METABOLIC PANEL
ALBUMIN: 4.4 g/dL (ref 3.5–5.2)
ALT: 16 U/L (ref 0–35)
AST: 21 U/L (ref 0–37)
Alkaline Phosphatase: 56 U/L (ref 39–117)
BUN: 13 mg/dL (ref 6–23)
CO2: 29 mEq/L (ref 19–32)
CREATININE: 1.02 mg/dL (ref 0.40–1.20)
Calcium: 9.9 mg/dL (ref 8.4–10.5)
Chloride: 103 mEq/L (ref 96–112)
GFR: 60.67 mL/min (ref >60.00)
GLUCOSE: 98 mg/dL (ref 70–99)
POTASSIUM: 4.4 meq/L (ref 3.5–5.1)
Sodium: 139 mEq/L (ref 135–145)
TOTAL PROTEIN: 7.5 g/dL (ref 6.0–8.3)
Total Bilirubin: 0.6 mg/dL (ref 0.2–1.2)

## 2017-12-27 LAB — CBC WITH DIFFERENTIAL/PLATELET
BASOS PCT: 0.4 % (ref 0.0–3.0)
Basophils Absolute: 0 10*3/uL (ref 0.0–0.1)
EOS ABS: 0.2 10*3/uL (ref 0.0–0.7)
Eosinophils Relative: 3.1 % (ref 0.0–5.0)
HCT: 43.2 % (ref 36.0–46.0)
Hemoglobin: 14.7 g/dL (ref 12.0–15.0)
LYMPHS ABS: 2 10*3/uL (ref 0.7–4.0)
Lymphocytes Relative: 26 % (ref 12.0–46.0)
MCHC: 34.1 g/dL (ref 30.0–36.0)
MCV: 91.1 fl (ref 78.0–100.0)
Monocytes Absolute: 0.8 10*3/uL (ref 0.1–1.0)
Monocytes Relative: 10.1 % (ref 3.0–12.0)
NEUTROS ABS: 4.6 10*3/uL (ref 1.4–7.7)
NEUTROS PCT: 60.4 % (ref 43.0–77.0)
PLATELETS: 301 10*3/uL (ref 150.0–400.0)
RBC: 4.75 Mil/uL (ref 3.87–5.11)
RDW: 12.4 % (ref 11.5–15.5)
WBC: 7.6 10*3/uL (ref 4.0–10.5)

## 2017-12-27 LAB — TSH: TSH: 3.16 u[IU]/mL (ref 0.35–4.50)

## 2017-12-27 LAB — LIPID PANEL
Cholesterol: 171 mg/dL (ref 0–200)
HDL: 61.9 mg/dL (ref >39.00)
LDL Cholesterol: 91 mg/dL (ref 0–99)
NonHDL: 109.27
Total CHOL/HDL Ratio: 3
Triglycerides: 90 mg/dL (ref 0.0–149.0)
VLDL: 18 mg/dL (ref 0.0–40.0)

## 2017-12-27 MED ORDER — GABAPENTIN 100 MG PO CAPS: 100.0000 mg | ORAL_CAPSULE | Freq: Every evening | ORAL | 3 refills | Status: DC | PRN

## 2017-12-27 MED ORDER — CEFUROXIME AXETIL 500 MG PO TABS: ORAL_TABLET | ORAL | 0 refills | Status: DC

## 2017-12-27 NOTE — Assessment & Plan Note (Signed)
Had recurrent urinary tract infections Takes cefuroxime as needed only and has not had any urinary tract infections in the past 3 years or so Continue

## 2017-12-27 NOTE — Assessment & Plan Note (Signed)
Symptoms consistent with sciatica on left side Worse with menses and walking a lot Trial of gabapentin at bedtime 100-300 mg, which may also help her sleep She can take only as needed if she wishes She does exercise regularly Avoid bending, twisting and lifting If no improvement she will let me know and can refer to sports medicine/orthopedics

## 2017-12-27 NOTE — Assessment & Plan Note (Signed)
Monitored by gynecology

## 2017-12-27 NOTE — Assessment & Plan Note (Signed)
Continue aspirin 81 mg daily.   

## 2017-12-27 NOTE — Assessment & Plan Note (Signed)
For a while she has been experiencing daily GERD or burning in her esophagus.  She actually thinks this started after taking doxycycline a long time ago and having esophageal ulcer At this point she takes Tums on a daily basis and that does help.  On occasion she has difficulty swallowing Advised that she start taking Pepcid or Zantac over-the-counter Discussed the dangers of uncontrolled GERD If symptoms are not controlled with over-the-counter medications may need a prescription strength medication Advised her to contact me if needed

## 2018-01-14 ENCOUNTER — Encounter: Payer: Self-pay | Admitting: Internal Medicine

## 2018-04-03 ENCOUNTER — Encounter: Payer: Self-pay | Admitting: Internal Medicine

## 2018-04-03 ENCOUNTER — Ambulatory Visit (INDEPENDENT_AMBULATORY_CARE_PROVIDER_SITE_OTHER)
Admission: RE | Admit: 2018-04-03 | Discharge: 2018-04-03 | Disposition: A | Discharge status: Home / Self Care | Payer: 59 | Source: Ambulatory Visit | Attending: Internal Medicine | Admitting: Internal Medicine

## 2018-04-03 ENCOUNTER — Ambulatory Visit: Payer: 59 | Admitting: Internal Medicine

## 2018-04-03 VITALS — BP 110/72 | HR 82 | Temp 98.1°F | Resp 16 | Ht 60.0 in | Wt 137.0 lb

## 2018-04-03 DIAGNOSIS — M79644 Pain in right finger(s): Secondary | ICD-10-CM

## 2018-04-03 DIAGNOSIS — M7989 Other specified soft tissue disorders: Secondary | ICD-10-CM | POA: Diagnosis not present

## 2018-04-03 DIAGNOSIS — M79645 Pain in left finger(s): Secondary | ICD-10-CM | POA: Diagnosis not present

## 2018-04-03 MED ORDER — CICLOPIROX 8 % EX KIT: PACK | CUTANEOUS | 5 refills | Status: DC

## 2018-04-03 NOTE — Patient Instructions (Signed)
Have an xray of your hand today.   A referral was ordered for hand orthopedics - Dr Amedeo Plenty, [place].

## 2018-04-03 NOTE — Progress Notes (Signed)
Subjective:    Patient ID: Veronica Chen, female    DOB: [birth year], 52 y.o.   MRN: [id number]  HPI The patient is here for an acute visit.   Right index finger pain:  The pain started months ago.  The pain is getting worse.  She denies any obvious injury - the only thing she can think of was carrying a lot of hangers while helping her son move, but she is not sure if the pain started just after that.  She has swelling, but no redness. She has good ROM.  Some days the pain is worse than others.  Turning a key and using her hand a lot increases her pain.  She denies numbness/tingling in the hand.    She takes ibuprofen for other things and is not sure if it has helped.  The pain woke her up last night.  She denies any other joint pain or swelling.      Medications and allergies reviewed with patient and updated if appropriate.  Patient Active Problem List   Diagnosis Date Noted  . Sciatica of left side 12/27/2017  . GERD (gastroesophageal reflux disease) 12/27/2017  . Hearing loss of left ear 10/12/2016  . Low back pain 12/14/2015  . Vertigo 04/20/2015  . History of chronic urinary tract infection 03/31/2014  . Capsulitis of foot 02/04/2013  . Sesamoiditis 02/04/2013  . Allergic rhinitis   . Osteopenia   . THYROID NODULE 03/23/2010  . Protein S deficiency ([place]) 03/23/2010  . ECZEMA 03/23/2010    Current Outpatient Medications on File Prior to Visit  Medication Sig Dispense Refill  . aspirin 81 MG tablet Take 81 mg by mouth daily.    Marland Kitchen b complex vitamins tablet Take 1 tablet by mouth daily.    . cetirizine (ZYRTEC) 10 MG tablet Take 1 tablet (10 mg total) by mouth daily. 30 tablet 0  . fluticasone (FLONASE) 50 MCG/ACT nasal spray Place 2 sprays into both nostrils daily. 16 g 6  . gabapentin (NEURONTIN) 100 MG capsule Take 1-3 capsules (100-300 mg total) by mouth at bedtime as needed. 90 capsule 3  . MAGNESIUM PO Take by mouth.    Marland Kitchen VITAMIN E PO Take by mouth.     No  current facility-administered medications on file prior to visit.     Past Medical History:  Diagnosis Date  . 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 ECZEMA   . Palpitations   . Protein S deficiency ([place])   . PVC's (premature ventricular contractions)   . THYROID NODULE   . Vertigo     Past Surgical History:  Procedure Laterality Date  . CESAREAN SECTION     x2  . uterine ablation      Social History   Socioeconomic History  . Marital status: Married    Spouse name: Not on file  . Number of children: 2  . Years of education: college 2  . Highest education level: Not on file  Occupational History  . 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 Financial resource strain: Not on file  . Food insecurity:    Worry: Not on file    Inability: Not on file  . Transportation needs:    Medical: Not on file    Non-medical: Not on file  Tobacco Use  . Smoking status: Never Smoker  . Smokeless tobacco: Never Used  .  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 Alcohol use: No  . Drug use: No  . Sexual activity: Not on file  Lifestyle  . Physical activity:    Days per week: Not on file    Minutes per session: Not on file  . Stress: Not on file  Relationships  . Social connections:    Talks on phone: Not on file    Gets together: Not on file    Attends religious service: Not on file    Active member of club or organization: Not on file    Attends meetings of clubs or organizations: Not on file    Relationship status: Not on file  Other Topics Concern  . Not on file  Social History Narrative   Patient lives at home with husband [name]    Patient has 2 children.    Patient has a college education.    Patient is right handed.     Family History  Problem Relation Age of Onset  . Other Mother        parathyroid  .  Clotting disorder Father   . Heart attack Unknown     Review of Systems  Constitutional: Negative for chills and fever.  Neurological: Positive for weakness (right index finger only with certain activites). Negative for numbness.  and per HPI     Objective:   Vitals:   04/03/18 1322  BP: 110/72  Pulse: 82  Resp: 16  Temp: 98.1 F (36.7 C)  SpO2: 99%   BP Readings from Last 3 Encounters:  04/03/18 110/72  12/27/17 112/68  10/12/16 114/74   Wt Readings from Last 3 Encounters:  04/03/18 137 lb (62.1 kg)  12/27/17 134 lb (60.8 kg)  10/12/16 133 lb (60.3 kg)   Body mass index is 26.76 kg/m.   Physical Exam  Constitutional: She appears well-developed and well-nourished. No distress.  HENT:  Head: Normocephalic and atraumatic.  Cardiovascular: Intact distal pulses.  Musculoskeletal:  Right index finger MCP joint with mild swelling/synovitis - possible cyst on lateral aspect, tender with palpation, normal ROM, normal sensation, no deformity  Skin: Skin is warm and dry. She is not diaphoretic. No erythema.           Assessment & Plan:    See Problem List for Assessment and Plan of chronic medical problems.

## 2018-04-03 NOTE — Assessment & Plan Note (Signed)
Right index finger MCP pain Possible OA, less likely autoimmune arthritis or pseudogout, possible cyst formation near joint causing pain, possible ligament injury Xray today Referred to Dr Amedeo Plenty

## 2018-04-04 ENCOUNTER — Encounter: Payer: Self-pay | Admitting: Internal Medicine

## 2018-04-12 NOTE — Telephone Encounter (Signed)
LVM for pt to call back. Results sent through mychart.

## 2018-04-12 NOTE — Telephone Encounter (Signed)
Copied from [place] [id number]. Topic: Quick Communication - See Telephone Encounter >> Apr 12, 2018  8:33 AM Ahmed Prima L wrote: CRM for notification. See Telephone encounter for: 04/12/18.  Patient said she would like to get her xray results of her hand from over a week ago.

## 2018-05-06 DIAGNOSIS — M79644 Pain in right finger(s): Secondary | ICD-10-CM | POA: Diagnosis not present

## 2018-05-24 ENCOUNTER — Encounter: Payer: Self-pay | Admitting: Family

## 2018-05-24 ENCOUNTER — Ambulatory Visit: Payer: 59 | Admitting: Family

## 2018-05-24 VITALS — BP 116/82 | HR 83 | Temp 98.2°F | Ht 60.0 in | Wt 136.0 lb

## 2018-05-24 DIAGNOSIS — B349 Viral infection, unspecified: Secondary | ICD-10-CM | POA: Diagnosis not present

## 2018-05-24 DIAGNOSIS — R05 Cough: Secondary | ICD-10-CM | POA: Diagnosis not present

## 2018-05-24 DIAGNOSIS — J9801 Acute bronchospasm: Secondary | ICD-10-CM

## 2018-05-24 DIAGNOSIS — R059 Cough, unspecified: Secondary | ICD-10-CM

## 2018-05-24 MED ORDER — PREDNISONE 20 MG PO TABS: 40.0000 mg | ORAL_TABLET | Freq: Every day | ORAL | 0 refills | Status: DC

## 2018-05-24 MED ORDER — ALBUTEROL SULFATE HFA 108 (90 BASE) MCG/ACT IN AERS: 2.0000 | INHALATION_SPRAY | Freq: Four times a day (QID) | RESPIRATORY_TRACT | 2 refills | Status: DC | PRN

## 2018-05-24 MED ORDER — ALBUTEROL SULFATE (2.5 MG/3ML) 0.083% IN NEBU
2.5000 mg | INHALATION_SOLUTION | Freq: Once | RESPIRATORY_TRACT | Status: AC
  Administered 2018-05-24: 2.5 mg via RESPIRATORY_TRACT

## 2018-05-24 NOTE — Progress Notes (Signed)
Veronica Chen is a 53 y.o. female with the following history as recorded in [employer]:  Patient Active Problem List   Diagnosis Date Noted  . Finger pain, right 04/03/2018  . Sciatica of left side 12/27/2017  . GERD (gastroesophageal reflux disease) 12/27/2017  . Hearing loss of left ear 10/12/2016  . Low back pain 12/14/2015  . Vertigo 04/20/2015  . History of chronic urinary tract infection 03/31/2014  . Capsulitis of foot 02/04/2013  . Sesamoiditis 02/04/2013  . Allergic rhinitis   . Osteopenia   . THYROID NODULE 03/23/2010  . Protein S deficiency ([place]) 03/23/2010  . ECZEMA 03/23/2010    Current Outpatient Medications  Medication Sig Dispense Refill  . aspirin 81 MG tablet Take 81 mg by mouth daily.    Marland Kitchen b complex vitamins tablet Take 1 tablet by mouth daily.    . cetirizine (ZYRTEC) 10 MG tablet Take 1 tablet (10 mg total) by mouth daily. 30 tablet 0  . Ciclopirox (CICLOPIROX TREATMENT) 8 % KIT Apply daily to nails and after 7 days wipe off with alcohol 34.6 each 5  . fluticasone (FLONASE) 50 MCG/ACT nasal spray Place 2 sprays into both nostrils daily. 16 g 6  . gabapentin (NEURONTIN) 100 MG capsule Take 1-3 capsules (100-300 mg total) by mouth at bedtime as needed. 90 capsule 3  . MAGNESIUM PO Take by mouth.    Marland Kitchen VITAMIN E PO Take by mouth.    Marland Kitchen albuterol (PROVENTIL HFA;VENTOLIN HFA) 108 (90 Base) MCG/ACT inhaler Inhale 2 puffs into the lungs every 6 (six) hours as needed for wheezing or shortness of breath. 1 Inhaler 2  . predniSONE (DELTASONE) 20 MG tablet Take 2 tablets (40 mg total) by mouth daily with breakfast. 10 tablet 0   No current facility-administered medications for this visit.     Allergies: Patient has no known allergies.  Past Medical History:  Diagnosis Date  . 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 ECZEMA   . Palpitations   . Protein S deficiency ([place])   . PVC's (premature ventricular contractions)   .  THYROID NODULE   . Vertigo     Past Surgical History:  Procedure Laterality Date  . CESAREAN SECTION     x2  . uterine ablation      Family History  Problem Relation Age of Onset  . Other Mother        parathyroid  . Clotting disorder Father   . Heart attack Unknown     Social History   Tobacco Use  . Smoking status: Never Smoker  . Smokeless tobacco: Never Used  . 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 Alcohol use: No    Subjective:  Patient presents with concerns for 1 week history of cough, congestion; + barking cough; recent travel- felt like air in hotel was causing symptoms to be worse; feels that congestion has "settled" in her chest; using OTC Mucinex and Nyquil- actually feels like medications make symptoms worse; describes cough as uncontrollable coughing; no prior history of asthma/ bronchitis. Does have Protein S deficiency- takes daily ASA; no prior history of PE or DVT;      Objective:  Vitals:   05/24/18 1429  BP: 116/82  Pulse: 83  Temp: 98.2 F (36.8 C)  TempSrc: Oral  SpO2: 98%  Weight: 136 lb (61.7 kg)  Height: 5' (1.524 m)  General: Well developed, well nourished, in no acute distress  Skin : Warm and dry.  Head: Normocephalic and atraumatic  Eyes: Sclera and conjunctiva clear; pupils round and reactive to light; extraocular movements intact  Ears: External normal; canals clear; tympanic membranes normal  Oropharynx: Pink, supple. No suspicious lesions  Neck: Supple without thyromegaly, adenopathy  Lungs: Respirations unlabored; clear to auscultation bilaterally without wheeze, rales, rhonchi  CVS exam: normal rate and regular rhythm.  Neurologic: Alert and oriented; speech intact; face symmetrical; moves all extremities well; CNII-XII intact without focal deficit                                Assessment:  1. Cough   2. Acute bronchospasm due to viral infection      Plan:  Physical exam is unremarkable; Albuterol nebulizer treatment given in office with good relief; do not feel antibiotic is needed at this time; Rx for Prednisone 40 mg qd x 5 days and albuterol inhaler; increase fluids, rest; Strict ER precautions for upcoming weekend;  Follow-up if symptoms persist and will update CXR and labs;    No follow-ups on file.  No orders of the defined types were placed in this encounter.   Requested Prescriptions   Signed Prescriptions Disp Refills  . predniSONE (DELTASONE) 20 MG tablet 10 tablet 0    Sig: Take 2 tablets (40 mg total) by mouth daily with breakfast.  . albuterol (PROVENTIL HFA;VENTOLIN HFA) 108 (90 Base) MCG/ACT inhaler 1 Inhaler 2    Sig: Inhale 2 puffs into the lungs every 6 (six) hours as needed for wheezing or shortness of breath.

## 2018-05-24 NOTE — Patient Instructions (Signed)
Bronchospasm, Adult    Bronchospasm is when airways in the lungs get smaller. When this happens, it can be hard to breathe. You may cough. You may also make a whistling sound when you breathe (wheeze).  Follow these instructions at home:  Medicines   Take over-the-counter and prescription medicines only as told by your doctor.   If you need to use an inhaler or nebulizer to take your medicine, ask your doctor how to use it.   If you were given a spacer, always use it with your inhaler.  Lifestyle   Change your heating and air conditioning filter. Do this at least once a month.   Try not to use fireplaces and wood stoves.   Do not  smoke. Do not  allow smoking in your home.   Try not to use things that have a strong smell, like perfume.   Get rid of pests (such as roaches and mice) and their poop.   Remove any mold from your home.   Keep your house clean. Get rid of dust.   Use cleaning products that have no smell.   Replace carpet with wood, tile, or vinyl flooring.   Use allergy-proof pillows, mattress covers, and box spring covers.   Wash bed sheets and blankets every week. Use hot water. Dry them in a dryer.   Use blankets that are made of polyester or cotton.   Wash your hands often.   Keep pets out of your bedroom.   When you exercise, try not to breathe in cold air.  General instructions   Have a plan for getting medical care. Know these things:  ? When to call your doctor.  ? When to call local emergency services (911 in the [place]  ? Where to go in an emergency.   Stay up to date on your shots (immunizations).   When you have an episode:  ? Stay calm.  ? Relax.  ? Breathe slowly.  Contact a doctor if:   Your muscles ache.   Your chest hurts.   The color of the mucus you cough up (sputum) changes from clear or white to yellow, green, gray, or bloody.   The mucus you cough up gets thicker.   You have a fever.  Get help right away if:   The whistling sound gets worse, even after you  take your medicines.   Your coughing gets worse.   You find it even harder to breathe.   Your chest hurts very much.  Summary   Bronchospasm is when airways in the lungs get smaller.   When this happens, it can be hard to breathe. You may cough. You may also make a whistling sound when you breathe.   Stay away from things that cause you to have episodes. These include smoke or dust.  This information is not intended to replace advice given to you by your health care provider. Make sure you discuss any questions you have with your health care provider.  Document Released: 02/05/2009 Document Revised: 04/13/2016 Document Reviewed: 04/13/2016  Elsevier Interactive Patient Education  2019 [employer]

## 2018-06-06 ENCOUNTER — Ambulatory Visit (INDEPENDENT_AMBULATORY_CARE_PROVIDER_SITE_OTHER)
Admission: RE | Admit: 2018-06-06 | Discharge: 2018-06-06 | Disposition: A | Discharge status: Home / Self Care | Payer: 59 | Source: Ambulatory Visit | Attending: Nurse Practitioner | Admitting: Nurse Practitioner

## 2018-06-06 ENCOUNTER — Ambulatory Visit: Payer: 59 | Admitting: Nurse Practitioner

## 2018-06-06 ENCOUNTER — Encounter: Payer: Self-pay | Admitting: Nurse Practitioner

## 2018-06-06 ENCOUNTER — Other Ambulatory Visit (INDEPENDENT_AMBULATORY_CARE_PROVIDER_SITE_OTHER): Payer: 59

## 2018-06-06 VITALS — BP 90/62 | HR 96 | Temp 98.6°F | Wt 134.0 lb

## 2018-06-06 DIAGNOSIS — R0781 Pleurodynia: Secondary | ICD-10-CM | POA: Diagnosis not present

## 2018-06-06 DIAGNOSIS — R0789 Other chest pain: Secondary | ICD-10-CM

## 2018-06-06 DIAGNOSIS — R05 Cough: Secondary | ICD-10-CM | POA: Diagnosis not present

## 2018-06-06 DIAGNOSIS — R059 Cough, unspecified: Secondary | ICD-10-CM

## 2018-06-06 LAB — COMPREHENSIVE METABOLIC PANEL
ALBUMIN: 4 g/dL (ref 3.5–5.2)
ALT: 10 U/L (ref 0–35)
AST: 14 U/L (ref 0–37)
Alkaline Phosphatase: 57 U/L (ref 39–117)
BILIRUBIN TOTAL: 0.5 mg/dL (ref 0.2–1.2)
BUN: 11 mg/dL (ref 6–23)
CHLORIDE: 103 meq/L (ref 96–112)
CO2: 27 meq/L (ref 19–32)
CREATININE: 0.94 mg/dL (ref 0.40–1.20)
Calcium: 8.7 mg/dL (ref 8.4–10.5)
GFR: 62.62 mL/min (ref >60.00)
Glucose, Bld: 65 mg/dL — ABNORMAL LOW (ref 70–99)
Potassium: 3.4 mEq/L — ABNORMAL LOW (ref 3.5–5.1)
SODIUM: 139 meq/L (ref 135–145)
Total Protein: 6.6 g/dL (ref 6.0–8.3)

## 2018-06-06 LAB — CBC
HEMATOCRIT: 41.2 % (ref 36.0–46.0)
Hemoglobin: 13.9 g/dL (ref 12.0–15.0)
MCHC: 33.8 g/dL (ref 30.0–36.0)
MCV: 90.8 fl (ref 78.0–100.0)
Platelets: 311 10*3/uL (ref 150.0–400.0)
RBC: 4.54 Mil/uL (ref 3.87–5.11)
RDW: 12.4 % (ref 11.5–15.5)
WBC: 9.3 10*3/uL (ref 4.0–10.5)

## 2018-06-06 NOTE — Patient Instructions (Signed)
Head downstairs for labs and xrays  I will let you know when I get your results   Chest Wall Pain Chest wall pain is pain in or around the bones and muscles of your chest. Chest wall pain may be caused by:  An injury.  Coughing a lot.  Using your chest and arm muscles too much. Sometimes, the cause may not be known. This pain may take a few weeks or longer to get better. Follow these instructions at home: Managing pain, stiffness, and swelling If told, put ice on the painful area:  Put ice in a plastic bag.  Place a towel between your skin and the bag.  Leave the ice on for 20 minutes, 2-3 times a day.  Activity  Rest as told by your doctor.  Avoid doing things that cause pain. This includes lifting heavy items.  Ask your doctor what activities are safe for you. General instructions   Take over-the-counter and prescription medicines only as told by your doctor.  Do not use any products that contain nicotine or tobacco, such as cigarettes, e-cigarettes, and chewing tobacco. If you need help quitting, ask your doctor.  Keep all follow-up visits as told by your doctor. This is important. Contact a doctor if:  You have a fever.  Your chest pain gets worse.  You have new symptoms. Get help right away if:  You feel sick to your stomach (nauseous) or you throw up (vomit).  You feel sweaty or light-headed.  You have a cough with mucus from your lungs (sputum) or you cough up blood.  You are short of breath. These symptoms may be an emergency. Do not wait to see if the symptoms will go away. Get medical help right away. Call your local emergency services (911 in the [place] Do not drive yourself to the hospital. Summary  Chest wall pain is pain in or around the bones and muscles of your chest.  It may be treated with ice, rest, and medicines. Your condition may also get better if you avoid doing things that cause pain.  Contact a doctor if you have a fever, chest  pain that gets worse, or new symptoms.  Get help right away if you feel light-headed or you get short of breath. These symptoms may be an emergency. This information is not intended to replace advice given to you by your health care provider. Make sure you discuss any questions you have with your health care provider. Document Released: 09/27/2007 Document Revised: 10/11/2017 Document Reviewed: 10/11/2017 Elsevier Interactive Patient Education  2019 [employer].

## 2018-06-06 NOTE — Progress Notes (Signed)
Veronica Chen is a 53 y.o. female with the following history as recorded in [employer]:  Patient Active Problem List   Diagnosis Date Noted  . Finger pain, right 04/03/2018  . Sciatica of left side 12/27/2017  . GERD (gastroesophageal reflux disease) 12/27/2017  . Hearing loss of left ear 10/12/2016  . Low back pain 12/14/2015  . Vertigo 04/20/2015  . History of chronic urinary tract infection 03/31/2014  . Capsulitis of foot 02/04/2013  . Sesamoiditis 02/04/2013  . Allergic rhinitis   . Osteopenia   . THYROID NODULE 03/23/2010  . Protein S deficiency ([place]) 03/23/2010  . ECZEMA 03/23/2010    Current Outpatient Medications  Medication Sig Dispense Refill  . albuterol (PROVENTIL HFA;VENTOLIN HFA) 108 (90 Base) MCG/ACT inhaler Inhale 2 puffs into the lungs every 6 (six) hours as needed for wheezing or shortness of breath. 1 Inhaler 2  . aspirin 81 MG tablet Take 81 mg by mouth daily.    Marland Kitchen b complex vitamins tablet Take 1 tablet by mouth daily.    . cetirizine (ZYRTEC) 10 MG tablet Take 1 tablet (10 mg total) by mouth daily. 30 tablet 0  . Ciclopirox (CICLOPIROX TREATMENT) 8 % KIT Apply daily to nails and after 7 days wipe off with alcohol 34.6 each 5  . fluticasone (FLONASE) 50 MCG/ACT nasal spray Place 2 sprays into both nostrils daily. 16 g 6  . gabapentin (NEURONTIN) 100 MG capsule Take 1-3 capsules (100-300 mg total) by mouth at bedtime as needed. 90 capsule 3  . MAGNESIUM PO Take by mouth.    . predniSONE (DELTASONE) 20 MG tablet Take 2 tablets (40 mg total) by mouth daily with breakfast. 10 tablet 0  . VITAMIN E PO Take by mouth.     No current facility-administered medications for this visit.     Allergies: Patient has no known allergies.  Past Medical History:  Diagnosis Date  . 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 ECZEMA   . Palpitations   . Protein S deficiency ([place])   . PVC's (premature ventricular contractions)   .  THYROID NODULE   . Vertigo     Past Surgical History:  Procedure Laterality Date  . CESAREAN SECTION     x2  . uterine ablation      Family History  Problem Relation Age of Onset  . Other Mother        parathyroid  . Clotting disorder Father   . Heart attack Unknown     Social History   Tobacco Use  . Smoking status: Never Smoker  . Smokeless tobacco: Never Used  . 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 Alcohol use: No     Subjective:  Veronica Chen is here today requesting evaluation of acute complaint of right sided rib/chest wall pain. She was Seen by another provider in our clinic on 05/24/18 for cough, treated with prednisone course, albuterol inhaler  She has completed prednisone course, using albuterol prn- only once yesterday, says cough has greatly improved, just with occ mild dry cough now, she overall feels "much better" but over past few days she has developed right sided rib pain, hurts to touch, move, cough, lay on her right side. She denies fevers, chills, weakness, dizziness, syncope, rash, abnormal bruising or bleeding, sob, palpitations, tachycardia, abdominal pain, n/v. Hx protein s deficiency, has never had a PE,  DVT in the past   ROS- See HPI   Objective:  Vitals:   06/06/18 0805  BP: 90/62  Pulse: 96  Temp: 98.6 F (37 C)  SpO2: 99%  Weight: 134 lb (60.8 kg)    General: Well developed, well nourished, in no acute distress  Skin : Warm and dry. No rash, bruising noted. Head: Normocephalic and atraumatic  Eyes: Sclera and conjunctiva clear; pupils round and reactive to light; extraocular movements intact  Ears: External normal; canals clear; tympanic membranes normal  Oropharynx: Pink, supple. No suspicious lesions  Neck: Supple without thyromegaly, adenopathy  Lungs: Respirations unlabored; clear to auscultation bilaterally without wheeze, rales, rhonchi  CVS exam: normal  rate and regular rhythm, S1 and S2 normal.  Pulmonary/Chest: She exhibits tenderness. She exhibits no mass, no edema, no deformity and no swelling.  Extremities: No edema, cyanosis, clubbing  Vessels: Symmetric bilaterally  Neurologic: Alert and oriented; speech intact; face symmetrical; moves all extremities well; CNII-XII intact without focal deficit  Psychiatric: Normal mood and affect.   Assessment:  1. Chest wall pain   2. Rib pain   3. Cough     Plan:   Suspect chest wall pain/irritation due to cough Update labs, imaging Home management, red flags and return precautions including when to seek immediate/emergency care discussed and printed on AVS F/U with further recommendations pending test results  No follow-ups on file.  Orders Placed This Encounter  Procedures  . DG Chest 2 View    Standing Status:   Future    Standing Expiration Date:   08/05/2019    Order Specific Question:   Reason for Exam (SYMPTOM  OR DIAGNOSIS REQUIRED)    Answer:   chest wall pain, recent cough    Order Specific Question:   Is patient pregnant?    Answer:   No    Order Specific Question:   Preferred imaging location?    Answer:   [facility]    Order Specific Question:   Radiology Contrast Protocol - do NOT remove file path    Answer:   \\[email or web address]\DXFluoroContrastProtocols.pdf  . DG Ribs Unilateral Right    Standing Status:   Future    Standing Expiration Date:   08/05/2019    Order Specific Question:   Reason for Exam (SYMPTOM  OR DIAGNOSIS REQUIRED)    Answer:   chest wall pain, recent cough    Order Specific Question:   Is patient pregnant?    Answer:   No    Order Specific Question:   Preferred imaging location?    Answer:   [facility]    Order Specific Question:   Radiology Contrast Protocol - do NOT remove file path    Answer:   \\[email or web address]\DXFluoroContrastProtocols.pdf  . CBC    Standing Status:   Future    Number of Occurrences:   1     Standing Expiration Date:   06/07/2019  . Comprehensive metabolic panel    Standing Status:   Future    Number of Occurrences:   1    Standing Expiration Date:   06/07/2019    Requested Prescriptions    No prescriptions requested or ordered in this encounter

## 2018-06-07 ENCOUNTER — Other Ambulatory Visit: Payer: Self-pay | Admitting: Nurse Practitioner

## 2018-06-07 MED ORDER — MELOXICAM 7.5 MG PO TABS: 7.5000 mg | ORAL_TABLET | Freq: Every day | ORAL | 0 refills | Status: DC

## 2018-06-13 ENCOUNTER — Encounter: Payer: Self-pay | Admitting: *Deleted

## 2018-06-14 DIAGNOSIS — H5213 Myopia, bilateral: Secondary | ICD-10-CM | POA: Diagnosis not present

## 2018-06-14 DIAGNOSIS — Z135 Encounter for screening for eye and ear disorders: Secondary | ICD-10-CM | POA: Diagnosis not present

## 2018-07-19 ENCOUNTER — Telehealth: Payer: Self-pay | Admitting: Internal Medicine

## 2018-07-19 NOTE — Telephone Encounter (Signed)
Please advise 

## 2018-07-19 NOTE — Telephone Encounter (Signed)
There are no known respiratory viruses that can be transmitted by blood, so it is safe for her to donate blood.    Her illness could have been coronavirus - it is impossible to know at this point.

## 2018-07-19 NOTE — Telephone Encounter (Signed)
rec'd message on COVID 19 question line; requested call to discuss previous illness in 04/2018.    Ret'd call to pt.  Reported she traveled to [place], [place]. Area, in late January, spent 3 days there, and had prolonged interaction with another person, from a cruise ship, that had been to the [place].   Reported about 8 days later, she became very sick, and had a terrible cough, shortness of breath, and fever.  Stated she was seen in the office for her symptoms.  Reported she had never been so sick before, and it took 3-4 weeks to get over this.  Is questioning if her illness could have been the Coronavirus?  Stated "the only reason I am bringing this up now, is I want to offer that I could give a blood sample, if there is a need to research antibodies, with people that could have had the Coronavirus."   Pt. stated she is well now; denied any current symptoms.  Advised will send this message to Dr. Quay Burow.  Pt. stated "I don't want to waste anyone's time, but if I can help someone else, then I wanted to make that known."

## 2018-07-19 NOTE — Telephone Encounter (Signed)
Pt aware and understood response.

## 2018-07-23 ENCOUNTER — Encounter: Payer: Self-pay | Admitting: Internal Medicine

## 2018-08-23 ENCOUNTER — Encounter: Payer: Self-pay | Admitting: Internal Medicine

## 2018-11-18 ENCOUNTER — Encounter: Payer: Self-pay | Admitting: Internal Medicine

## 2019-01-17 ENCOUNTER — Other Ambulatory Visit: Payer: Self-pay

## 2019-01-17 ENCOUNTER — Ambulatory Visit (INDEPENDENT_AMBULATORY_CARE_PROVIDER_SITE_OTHER): Payer: 59

## 2019-01-17 DIAGNOSIS — Z23 Encounter for immunization: Secondary | ICD-10-CM | POA: Diagnosis not present

## 2019-02-21 ENCOUNTER — Other Ambulatory Visit: Payer: Self-pay

## 2019-02-21 ENCOUNTER — Ambulatory Visit (INDEPENDENT_AMBULATORY_CARE_PROVIDER_SITE_OTHER): Payer: 59 | Admitting: Internal Medicine

## 2019-02-21 ENCOUNTER — Encounter: Payer: Self-pay | Admitting: Internal Medicine

## 2019-02-21 ENCOUNTER — Other Ambulatory Visit: Payer: 59

## 2019-02-21 VITALS — BP 122/80 | HR 92 | Temp 99.0°F | Resp 16 | Ht 60.0 in | Wt 136.0 lb

## 2019-02-21 DIAGNOSIS — N3000 Acute cystitis without hematuria: Secondary | ICD-10-CM | POA: Diagnosis not present

## 2019-02-21 DIAGNOSIS — R300 Dysuria: Secondary | ICD-10-CM

## 2019-02-21 DIAGNOSIS — N390 Urinary tract infection, site not specified: Secondary | ICD-10-CM | POA: Insufficient documentation

## 2019-02-21 LAB — POCT URINALYSIS DIPSTICK
Bilirubin, UA: NEGATIVE
Glucose, UA: NEGATIVE
Ketones, UA: NEGATIVE
Protein, UA: NEGATIVE
Spec Grav, UA: 1.01 (ref 1.010–1.025)
Urobilinogen, UA: NEGATIVE E.U./dL — AB
pH, UA: 6 (ref 5.0–8.0)

## 2019-02-21 MED ORDER — SULFAMETHOXAZOLE-TRIMETHOPRIM 800-160 MG PO TABS: 1.0000 | ORAL_TABLET | Freq: Two times a day (BID) | ORAL | 0 refills | Status: DC

## 2019-02-21 MED ORDER — FLUCONAZOLE 150 MG PO TABS: 150.0000 mg | ORAL_TABLET | Freq: Once | ORAL | 0 refills | Status: AC

## 2019-02-21 NOTE — Assessment & Plan Note (Addendum)
Urine dip consistent with UTI Will send urine for culture Take the antibiotic as prescribed.  (Bactrim) Diflucan prescribed in case she needs after completing the antibiotic Take tylenol if needed.   Increase your water intake.  Call if no improvement

## 2019-02-21 NOTE — Patient Instructions (Addendum)
Take the antibiotic as prescribed.  Take tylenol if needed.    Diflucan was sent to your pharmacy if you need it.    Increase your water intake.   Call if no improvement     Urinary Tract Infection, Adult A urinary tract infection (UTI) is an infection of any part of the urinary tract, which includes the kidneys, ureters, bladder, and urethra. These organs make, store, and get rid of urine in the body. UTI can be a bladder infection (cystitis) or kidney infection (pyelonephritis). What are the causes? This infection may be caused by fungi, viruses, or bacteria. Bacteria are the most common cause of UTIs. This condition can also be caused by repeated incomplete emptying of the bladder during urination. What increases the risk? This condition is more likely to develop if:  You ignore your need to urinate or hold urine for long periods of time.  You do not empty your bladder completely during urination.  You wipe back to front after urinating or having a bowel movement, if you are female.  You are uncircumcised, if you are female.  You are constipated.  You have a urinary catheter that stays in place (indwelling).  You have a weak defense (immune) system.  You have a medical condition that affects your bowels, kidneys, or bladder.  You have diabetes.  You take antibiotic medicines frequently or for long periods of time, and the antibiotics no longer work well against certain types of infections (antibiotic resistance).  You take medicines that irritate your urinary tract.  You are exposed to chemicals that irritate your urinary tract.  You are female.  What are the signs or symptoms? Symptoms of this condition include:  Fever.  Frequent urination or passing small amounts of urine frequently.  Needing to urinate urgently.  Pain or burning with urination.  Urine that smells bad or unusual.  Cloudy urine.  Pain in the lower abdomen or back.  Trouble urinating.   Blood in the urine.  Vomiting or being less hungry than normal.  Diarrhea or abdominal pain.  Vaginal discharge, if you are female.  How is this diagnosed? This condition is diagnosed with a medical history and physical exam. You will also need to provide a urine sample to test your urine. Other tests may be done, including:  Blood tests.  Sexually transmitted disease (STD) testing.  If you have had more than one UTI, a cystoscopy or imaging studies may be done to determine the cause of the infections. How is this treated? Treatment for this condition often includes a combination of two or more of the following:  Antibiotic medicine.  Other medicines to treat less common causes of UTI.  Over-the-counter medicines to treat pain.  Drinking enough water to stay hydrated.  Follow these instructions at home:  Take over-the-counter and prescription medicines only as told by your health care provider.  If you were prescribed an antibiotic, take it as told by your health care provider. Do not stop taking the antibiotic even if you start to feel better.  Avoid alcohol, caffeine, tea, and carbonated beverages. They can irritate your bladder.  Drink enough fluid to keep your urine clear or pale yellow.  Keep all follow-up visits as told by your health care provider. This is important.  Make sure to: ? Empty your bladder often and completely. Do not hold urine for long periods of time. ? Empty your bladder before and after sex. ? Wipe from front to back after a bowel  movement if you are female. Use each tissue one time when you wipe. Contact a health care provider if:  You have back pain.  You have a fever.  You feel nauseous or vomit.  Your symptoms do not get better after 3 days.  Your symptoms go away and then return. Get help right away if:  You have severe back pain or lower abdominal pain.  You are vomiting and cannot keep down any medicines or water. This  information is not intended to replace advice given to you by your health care provider. Make sure you discuss any questions you have with your health care provider. Document Released: 01/18/2005 Document Revised: 09/22/2015 Document Reviewed: 03/01/2015 Elsevier Interactive Patient Education  [employer].

## 2019-02-21 NOTE — Progress Notes (Signed)
Subjective:    Patient ID: Veronica Chen, female    DOB: May 31, 1965, 53 y.o.   MRN: [id number]  HPI The patient is here for an acute visit for ? UTI.   She takes cefuroxime prn for urinary discomfort.  This has been effective against preventing urinary tract infections.  She also drinks cranberry juice.  She took cefuroxime a couple of weeks ago because she started to have some discomfort.  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  She ended up getting a yeast infection and took over-the-counter medications, which helped.  She started again having urinary symptoms and is concerned she has a UTI again.  She has been taking Azo over-the-counter.  She states dysuria, urinary frequency and urinary urgency.  She is not having any blood in the urine, abdominal pain, nausea, back pain or fevers.  She is perimenopausal.  Medications and allergies reviewed with patient and updated if appropriate.  Patient Active Problem List   Diagnosis Date Noted  . Finger pain, right 04/03/2018  . Sciatica of left side 12/27/2017  . GERD (gastroesophageal reflux disease) 12/27/2017  . Hearing loss of left ear 10/12/2016  . Low back pain 12/14/2015  . Vertigo 04/20/2015  . History of chronic urinary tract infection 03/31/2014  . Capsulitis of foot 02/04/2013  . Sesamoiditis 02/04/2013  . Allergic rhinitis   . Osteopenia   . THYROID NODULE 03/23/2010  . Protein S deficiency ([place]) 03/23/2010  . ECZEMA 03/23/2010    Current Outpatient Medications on File Prior to Visit  Medication Sig Dispense Refill  . albuterol (PROVENTIL HFA;VENTOLIN HFA) 108 (90 Base) MCG/ACT inhaler Inhale 2 puffs into the lungs every 6 (six) hours as needed for wheezing or shortness of breath. 1 Inhaler 2  . Ascorbic Acid (VITAMIN C) 100 MG tablet Take 100 mg by mouth daily.    Marland Kitchen aspirin 81 MG tablet Take 81 mg by  mouth daily.    Marland Kitchen b complex vitamins tablet Take 1 tablet by mouth daily.    . cetirizine (ZYRTEC) 10 MG tablet Take 1 tablet (10 mg total) by mouth daily. 30 tablet 0  . fluticasone (FLONASE) 50 MCG/ACT nasal spray Place 2 sprays into both nostrils daily. 16 g 6  . gabapentin (NEURONTIN) 100 MG capsule Take 1-3 capsules (100-300 mg total) by mouth at bedtime as needed. 90 capsule 3  . MAGNESIUM PO Take by mouth.    . meloxicam (MOBIC) 7.5 MG tablet Take 1 tablet (7.5 mg total) by mouth daily. 30 tablet 0  . VITAMIN D, CHOLECALCIFEROL, PO Take by mouth.    Marland Kitchen VITAMIN E PO Take by mouth.     No current facility-administered medications on file prior to visit.     Past Medical History:  Diagnosis Date  . 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 ECZEMA   . Palpitations   . Protein S deficiency ([place])   . PVC's (premature ventricular contractions)   . THYROID NODULE   . Vertigo     Past Surgical History:  Procedure Laterality Date  . CESAREAN SECTION     x2  . uterine ablation      Social History   Socioeconomic History  . Marital status: Married    Spouse name: Not on file  . Number  of children: 2  . Years of education: college 2  . Highest education level: Not on file  Occupational History  . 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 Financial resource strain: Not on file  . Food insecurity    Worry: Not on file    Inability: Not on file  . Transportation needs    Medical: Not on file    Non-medical: Not on file  Tobacco Use  . Smoking status: Never Smoker  . Smokeless tobacco: Never Used  . 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 Alcohol use: No  . Drug use: No  . Sexual activity: Not on file  Lifestyle  . Physical activity    Days per week: Not on file    Minutes per session: Not on file  . Stress: Not  on file  Relationships  . Social Herbalist on phone: Not on file    Gets together: Not on file    Attends religious service: Not on file    Active member of club or organization: Not on file    Attends meetings of clubs or organizations: Not on file    Relationship status: Not on file  Other Topics Concern  . Not on file  Social History Narrative   Patient lives at home with husband [name]    Patient has 2 children.    Patient has a college education.    Patient is right handed.     Family History  Problem Relation Age of Onset  . Other Mother        parathyroid  . Clotting disorder Father   . Heart attack Unknown     Review of Systems  Constitutional: Negative for fever.  Gastrointestinal: Negative for abdominal pain and nausea.  Genitourinary: Positive for dysuria, frequency and urgency. Negative for hematuria.       No odor  Musculoskeletal: Negative for back pain.       Objective:   Vitals:   02/21/19 1026  BP: 122/80  Pulse: 92  Resp: 16  Temp: 99 F (37.2 C)  SpO2: 99%   BP Readings from Last 3 Encounters:  02/21/19 122/80  06/06/18 90/62  05/24/18 116/82   Wt Readings from Last 3 Encounters:  02/21/19 136 lb (61.7 kg)  06/06/18 134 lb (60.8 kg)  05/24/18 136 lb (61.7 kg)   Body mass index is 26.56 kg/m.   Physical Exam Constitutional:      General: She is not in acute distress.    Appearance: Normal appearance. She is not ill-appearing.  HENT:     Head: Normocephalic and atraumatic.  Abdominal:     General: There is no distension.     Palpations: Abdomen is soft.     Tenderness: There is no abdominal tenderness. There is no right CVA tenderness or left CVA tenderness.  Skin:    General: Skin is warm and dry.  Neurological:     Mental Status: She is alert.            Assessment & Plan:    See Problem List for Assessment and Plan of chronic medical problems.

## 2019-02-23 ENCOUNTER — Encounter: Payer: Self-pay | Admitting: Internal Medicine

## 2019-02-23 LAB — URINE CULTURE
MICRO NUMBER:: 1049088
SPECIMEN QUALITY:: ADEQUATE

## 2019-04-24 DIAGNOSIS — J302 Other seasonal allergic rhinitis: Secondary | ICD-10-CM | POA: Insufficient documentation

## 2019-04-28 ENCOUNTER — Other Ambulatory Visit: Payer: Self-pay | Admitting: Obstetrics and Gynecology

## 2019-04-28 DIAGNOSIS — E041 Nontoxic single thyroid nodule: Secondary | ICD-10-CM

## 2019-04-30 ENCOUNTER — Other Ambulatory Visit: Payer: Self-pay

## 2019-04-30 ENCOUNTER — Ambulatory Visit (HOSPITAL_COMMUNITY)
Admission: RE | Admit: 2019-04-30 | Discharge: 2019-04-30 | Disposition: A | Discharge status: Home / Self Care | Payer: 59 | Source: Ambulatory Visit | Attending: Obstetrics and Gynecology | Admitting: Obstetrics and Gynecology

## 2019-04-30 DIAGNOSIS — E041 Nontoxic single thyroid nodule: Secondary | ICD-10-CM

## 2019-06-12 ENCOUNTER — Encounter: Payer: Self-pay | Admitting: Internal Medicine

## 2019-09-15 ENCOUNTER — Other Ambulatory Visit: Payer: Self-pay

## 2019-09-15 ENCOUNTER — Ambulatory Visit: Payer: 59 | Admitting: Podiatry

## 2019-09-15 ENCOUNTER — Ambulatory Visit (INDEPENDENT_AMBULATORY_CARE_PROVIDER_SITE_OTHER): Payer: 59

## 2019-09-15 DIAGNOSIS — M79672 Pain in left foot: Secondary | ICD-10-CM

## 2019-09-15 DIAGNOSIS — M7752 Other enthesopathy of left foot: Secondary | ICD-10-CM

## 2019-09-16 ENCOUNTER — Encounter: Payer: Self-pay | Admitting: Podiatry

## 2019-09-16 NOTE — Progress Notes (Signed)
Subjective:  Patient ID: Veronica Chen, female    DOB: 09-19-1965,  MRN: [id number]  Chief Complaint  Patient presents with  . Foot Pain    pt is here for left foot pain, pt states that the left foot pain is on the left plantar forefoot which has been going on for multiple years, pt states it is painful to the touch.    54 y.o. female presents with the above complaint.  Patient presents with left submetatarsal 2 MPJ pain.  Patient states it has been going for quite some time about 5 to 10 years progressive getting worse.  Patient states is worse when ambulating and when bending the digits on the metatarsal heads.  Patient states his pain is consistent.  Pain is elevated when walking on it or wearing shoes.  Pain scale is 8 out of 10.  She would like to know what other various treatment options.  She would like to make sure that there is nothing broken.  She denies any other acute complaints.   Review of Systems: Negative except as noted in the HPI. Denies N/V/F/Ch.  Past Medical History:  Diagnosis Date  . 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 ECZEMA   . Palpitations   . Protein S deficiency ([place])   . PVC's (premature ventricular contractions)   . THYROID NODULE   . Vertigo     Current Outpatient Medications:  .  albuterol (PROVENTIL HFA;VENTOLIN HFA) 108 (90 Base) MCG/ACT inhaler, Inhale 2 puffs into the lungs every 6 (six) hours as needed for wheezing or shortness of breath., Disp: 1 Inhaler, Rfl: 2 .  Ascorbic Acid (VITAMIN C) 100 MG tablet, Take 100 mg by mouth daily., Disp: , Rfl:  .  aspirin 81 MG tablet, Take 81 mg by mouth daily., Disp: , Rfl:  .  b complex vitamins tablet, Take 1 tablet by mouth daily., Disp: , Rfl:  .  cetirizine (ZYRTEC) 10 MG tablet, Take 1 tablet (10 mg total) by mouth daily., Disp: 30 tablet, Rfl: 0 .  fluticasone (FLONASE) 50 MCG/ACT nasal spray, Place 2 sprays into both nostrils daily., Disp: 16 g, Rfl:  6 .  gabapentin (NEURONTIN) 100 MG capsule, Take 1-3 capsules (100-300 mg total) by mouth at bedtime as needed., Disp: 90 capsule, Rfl: 3 .  MAGNESIUM PO, Take by mouth., Disp: , Rfl:  .  meloxicam (MOBIC) 7.5 MG tablet, Take 1 tablet (7.5 mg total) by mouth daily., Disp: 30 tablet, Rfl: 0 .  sulfamethoxazole-trimethoprim (BACTRIM DS) 800-160 MG tablet, Take 1 tablet by mouth 2 (two) times daily., Disp: 14 tablet, Rfl: 0 .  VITAMIN D, CHOLECALCIFEROL, PO, Take by mouth., Disp: , Rfl:  .  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 There is no height or weight on file to calculate BMI. Constitutional Well developed. Well nourished.  Vascular Dorsalis pedis pulses palpable bilaterally. Posterior tibial pulses palpable bilaterally. Capillary refill normal to all digits.  No cyanosis or clubbing noted. Pedal hair growth normal.  Neurologic Normal speech. Oriented to person, place, and time. Epicritic sensation to light touch grossly present bilaterally.  Dermatologic Nails well groomed and  normal in appearance. No open wounds. No skin lesions.  Orthopedic:  Pain on palpation to the left second MPJ.  No pain with range of motion of the MPJ.  No intra-articular pain at the MPJ noted.  Pain on the plantar aspect of the left second metatarsophalangeal joint.  Positive Lachman stress test with a possible attenuation of the plantar plate.   Radiographs: 3 views of skeletally mature adult left foot: There is decreasing calcaneal inclination angle anterior break in the cyma line increase in talar declination angle mild elevatus of the first ray noted.  There is plantar and posterior heel spurring noted.  No other bony abnormalities identified.   There is decrease in first metatarsophalangeal joint spacing consistent with beginning arthritic changes.  Assessment:   1. Foot pain, left   2. Capsulitis of metatarsophalangeal (MTP) joint of left foot    Plan:  Patient was evaluated and treated and all questions answered.  Left submet 2 metatarsophalangeal joint -I explained the patient the etiology of the joint and various treatment options were extensively discussed.  Given the amount of pain that she is having likely due to the attenuation of the plantar plate as opposed to capsulitis/intra-articular of metatarsophalangeal joint, I believe patient will benefit from a steroid injection to help decrease inflammatory component associated with pain.  Patient agrees with the plan would like to proceed with a steroid injection. -A steroid injection was performed at left second metatarsophalangeal joint using 1% plain Lidocaine and 10 mg of Kenalog. This was well tolerated. -Metatarsal pad was also dispensed to help offload the area.  If there is improvement we will discuss orthotics management or next visit.  No follow-ups on file.

## 2019-09-17 ENCOUNTER — Other Ambulatory Visit: Payer: Self-pay | Admitting: Podiatry

## 2019-09-17 DIAGNOSIS — M7752 Other enthesopathy of left foot: Secondary | ICD-10-CM

## 2020-01-07 ENCOUNTER — Ambulatory Visit: Payer: 59 | Admitting: Podiatry

## 2020-01-07 ENCOUNTER — Other Ambulatory Visit: Payer: Self-pay

## 2020-01-07 DIAGNOSIS — M7752 Other enthesopathy of left foot: Secondary | ICD-10-CM

## 2020-01-07 DIAGNOSIS — M79672 Pain in left foot: Secondary | ICD-10-CM | POA: Diagnosis not present

## 2020-01-07 DIAGNOSIS — S99922A Unspecified injury of left foot, initial encounter: Secondary | ICD-10-CM | POA: Diagnosis not present

## 2020-01-09 ENCOUNTER — Encounter: Payer: Self-pay | Admitting: Podiatry

## 2020-01-09 NOTE — Progress Notes (Signed)
Subjective:  Patient ID: Veronica Chen, female    DOB: [birth year],  MRN: [id number]  Chief Complaint  Patient presents with  . Foot Pain    Left foot pain. PT stated that her left foot is still hurting on the top and bottom of 2nd and 3rd toe and by the end of the day it is swollen and numb/tingly     54 y.o. female presents with the above complaint.  Patient presents with follow-up of left submetatarsal 2 metatarsophalangeal joint pain.  Patient states that the injection did help however she went to [facility] and did about 15 to 20 miles hike which may have reaggravated it.  Patient states is now swollen at the end of the day.  Is numb and tingly.  Patient denies any other acute complaints.   Review of Systems: Negative except as noted in the HPI. Denies N/V/F/Ch.  Past Medical History:  Diagnosis Date  . 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 ECZEMA   . Palpitations   . Protein S deficiency ([place])   . PVC's (premature ventricular contractions)   . THYROID NODULE   . Vertigo     Current Outpatient Medications:  .  albuterol (PROVENTIL HFA;VENTOLIN HFA) 108 (90 Base) MCG/ACT inhaler, Inhale 2 puffs into the lungs every 6 (six) hours as needed for wheezing or shortness of breath., Disp: 1 Inhaler, Rfl: 2 .  Ascorbic Acid (VITAMIN C) 100 MG tablet, Take 100 mg by mouth daily., Disp: , Rfl:  .  aspirin 81 MG tablet, Take 81 mg by mouth daily., Disp: , Rfl:  .  b complex vitamins tablet, Take 1 tablet by mouth daily., Disp: , Rfl:  .  cetirizine (ZYRTEC) 10 MG tablet, Take 1 tablet (10 mg total) by mouth daily., Disp: 30 tablet, Rfl: 0 .  fluticasone (FLONASE) 50 MCG/ACT nasal spray, Place 2 sprays into both nostrils daily., Disp: 16 g, Rfl: 6 .  gabapentin (NEURONTIN) 100 MG capsule, Take 1-3 capsules (100-300 mg total) by mouth at bedtime as needed., Disp: 90 capsule, Rfl: 3 .  MAGNESIUM PO, Take by mouth., Disp: , Rfl:  .  meloxicam  (MOBIC) 7.5 MG tablet, Take 1 tablet (7.5 mg total) by mouth daily., Disp: 30 tablet, Rfl: 0 .  metroNIDAZOLE (METROCREAM) 0.75 % cream, SMARTSIG:1 Application Topical 1 to 2 Times Daily, Disp: , Rfl:  .  nitrofurantoin, macrocrystal-monohydrate, (MACROBID) 100 MG capsule, Take 100 mg by mouth daily as needed., Disp: , Rfl:  .  sulfamethoxazole-trimethoprim (BACTRIM DS) 800-160 MG tablet, Take 1 tablet by mouth 2 (two) times daily., Disp: 14 tablet, Rfl: 0 .  VITAMIN D, CHOLECALCIFEROL, PO, Take by mouth., Disp: , Rfl:  .  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 There is no height or weight on file to calculate BMI. Constitutional Well developed. Well nourished.  Vascular Dorsalis pedis pulses palpable bilaterally. Posterior tibial pulses palpable bilaterally. Capillary refill normal to all digits.  No cyanosis or clubbing noted. Pedal hair growth normal.  Neurologic Normal speech. Oriented to person, place, and time. Epicritic sensation to light touch grossly present bilaterally.  Dermatologic Nails well groomed and  normal in appearance. No open wounds. No skin lesions.  Orthopedic:  Pain on palpation to the left second MPJ.  No pain with range of motion of the MPJ.  No intra-articular pain at the MPJ noted.  Pain on the plantar aspect of the left second metatarsophalangeal joint.  Positive Lachman stress test with a possible attenuation of the plantar plate.   Radiographs: 3 views of skeletally mature adult left foot: There is decreasing calcaneal inclination angle anterior break in the cyma line increase in talar declination angle mild elevatus of the first ray noted.  There is plantar and posterior heel spurring noted.  No  other bony abnormalities identified.  There is decrease in first metatarsophalangeal joint spacing consistent with beginning arthritic changes.  Assessment:   1. Foot pain, left    Plan:  Patient was evaluated and treated and all questions answered.  Left submet 2 metatarsophalangeal joint capsulitis secondary to plantar plate injury -I explained the patient the etiology of the joint and various treatment options were extensively discussed.  Given the amount of pain that she is having likely due to the attenuation of the plantar plate as opposed to capsulitis/intra-articular of metatarsophalangeal joint, I believe patient will benefit from a steroid injection to help decrease inflammatory component associated with pain.  Patient agrees with the plan would like to proceed with a steroid injection. -A second steroid injection was performed at left second metatarsophalangeal joint using 1% plain Lidocaine and 10 mg of Kenalog. This was well tolerated. -Metatarsal pad was also dispensed to help offload the area.  If there is improvement we will discuss orthotics management or next visit.  No follow-ups on file.

## 2020-01-13 ENCOUNTER — Encounter: Payer: 59 | Admitting: Internal Medicine

## 2020-01-27 NOTE — Progress Notes (Signed)
Subjective:    Patient ID: Veronica Chen, female    DOB: 13-Oct-1965, 54 y.o.   MRN: [id number]   This visit occurred during the SARS-CoV-2 public health emergency.  Safety protocols were in place, including screening questions prior to the visit, additional usage of staff PPE, and extensive cleaning of exam room while observing appropriate contact time as indicated for disinfecting solutions.  HPI She is here for follow up of her chronic medical conditions.    She has intermittent low back pain with radiculopathy and takes the gabapentin as needed which works well.     Deferred CPE   Medications and allergies reviewed with patient and updated if appropriate.  Patient Active Problem List   Diagnosis Date Noted  . Educated about COVID-19 virus infection 01/28/2020  . UTI (urinary tract infection) 02/21/2019  . Finger pain, right 04/03/2018  . Sciatica of left side 12/27/2017  . GERD (gastroesophageal reflux disease) 12/27/2017  . Hearing loss of left ear 10/12/2016  . Low back pain 12/14/2015  . Vertigo 04/20/2015  . History of chronic urinary tract infection 03/31/2014  . Capsulitis of foot 02/04/2013  . Sesamoiditis 02/04/2013  . Allergic rhinitis   . Osteopenia   . THYROID NODULE 03/23/2010  . Protein S deficiency ([place]) 03/23/2010  . ECZEMA 03/23/2010    Current Outpatient Medications on File Prior to Visit  Medication Sig Dispense Refill  . albuterol (PROVENTIL HFA;VENTOLIN HFA) 108 (90 Base) MCG/ACT inhaler Inhale 2 puffs into the lungs every 6 (six) hours as needed for wheezing or shortness of breath. 1 Inhaler 2  . amoxicillin (AMOXIL) 500 MG capsule Take 500 mg by mouth 3 (three) times daily.    . Ascorbic Acid (VITAMIN C) 100 MG tablet Take 100 mg by mouth daily.    Marland Kitchen aspirin 81 MG tablet Take 81 mg by mouth daily.    Marland Kitchen b complex vitamins tablet Take 1 tablet by mouth daily.    . cetirizine (ZYRTEC) 10 MG tablet Take 1 tablet (10 mg total) by mouth daily. 30  tablet 0  . Fluocinolone Acetonide 0.01 % OIL Instill 4 drops into both ears every night for 14 days. Then stop and use as needed for itching.    . fluticasone (FLONASE) 50 MCG/ACT nasal spray Place 2 sprays into both nostrils daily. 16 g 6  . gabapentin (NEURONTIN) 100 MG capsule Take 1-3 capsules (100-300 mg total) by mouth at bedtime as needed. 90 capsule 3  . MAGNESIUM PO Take by mouth.    . metroNIDAZOLE (METROCREAM) 0.75 % cream SMARTSIG:1 Application Topical 1 to 2 Times Daily    . nitrofurantoin, macrocrystal-monohydrate, (MACROBID) 100 MG capsule Take 100 mg by mouth daily as needed.    Marland Kitchen VITAMIN D, CHOLECALCIFEROL, PO Take by mouth.    Marland Kitchen VITAMIN E PO Take by mouth.     No current facility-administered medications on file prior to visit.    Past Medical History:  Diagnosis Date  . 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 ECZEMA   . Palpitations   . Protein S deficiency ([place])   . PVC's (premature ventricular contractions)   . THYROID NODULE   . Vertigo     Past Surgical History:  Procedure Laterality Date  . CESAREAN SECTION     x2  . uterine ablation      Social History   Socioeconomic History  . Marital status: Married  Spouse name: Not on file  . Number of children: 2  . Years of education: college 2  . Highest education level: Not on file  Occupational History  . 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 Smoking status: Never Smoker  . Smokeless tobacco: Never Used  . 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 Alcohol use: No  . Drug use: No  . Sexual activity: Not on file  Other Topics Concern  . Not on file  Social History Narrative   Patient lives at home with husband [name]    Patient has 2 children.    Patient has a college education.    Patient is right handed.    Social Determinants of  Health   Financial Resource Strain:   . Difficulty of Paying Living Expenses: Not on file  Food Insecurity:   . Worried About Charity fundraiser in the Last Year: Not on file  . Ran Out of Food in the Last Year: Not on file  Transportation Needs:   . Lack of Transportation (Medical): Not on file  . Lack of Transportation (Non-Medical): Not on file  Physical Activity:   . Days of Exercise per Week: Not on file  . Minutes of Exercise per Session: Not on file  Stress:   . Feeling of Stress : Not on file  Social Connections:   . Frequency of Communication with Friends and Family: Not on file  . Frequency of Social Gatherings with Friends and Family: Not on file  . Attends Religious Services: Not on file  . Active Member of Clubs or Organizations: Not on file  . Attends Archivist Meetings: Not on file  . Marital Status: Not on file    Family History  Problem Relation Age of Onset  . Other Mother        parathyroid  . Clotting disorder Father   . Heart attack Unknown     Review of Systems     Objective:   Vitals:   01/28/20 1309  BP: 116/78  Pulse: 85  Temp: 98.1 F (36.7 C)  SpO2: 99%   Filed Weights   01/28/20 1309  Weight: 136 lb (61.7 kg)   Body mass index is 26.56 kg/m.  BP Readings from Last 3 Encounters:  01/28/20 116/78  02/21/19 122/80  06/06/18 90/62    Wt Readings from Last 3 Encounters:  01/28/20 136 lb (61.7 kg)  02/21/19 136 lb (61.7 kg)  06/06/18 134 lb (60.8 kg)     Physical Exam Constitutional:      General: She is not in acute distress.    Appearance: Normal appearance. She is not ill-appearing.  Skin:    General: Skin is warm and dry.  Neurological:     Mental Status: She is alert.          Assessment & Plan:     See Problem List for Assessment and Plan of chronic medical problems.

## 2020-01-27 NOTE — Patient Instructions (Addendum)
Flu immunization administered today.    You can get the shingles vaccine next week.

## 2020-01-28 ENCOUNTER — Other Ambulatory Visit: Payer: Self-pay

## 2020-01-28 ENCOUNTER — Encounter: Payer: Self-pay | Admitting: Internal Medicine

## 2020-01-28 ENCOUNTER — Ambulatory Visit: Payer: 59 | Admitting: Internal Medicine

## 2020-01-28 VITALS — BP 116/78 | HR 85 | Temp 98.1°F | Ht 60.0 in | Wt 136.0 lb

## 2020-01-28 DIAGNOSIS — Z23 Encounter for immunization: Secondary | ICD-10-CM

## 2020-01-28 DIAGNOSIS — M545 Low back pain, unspecified: Secondary | ICD-10-CM

## 2020-01-28 DIAGNOSIS — G8929 Other chronic pain: Secondary | ICD-10-CM

## 2020-01-28 DIAGNOSIS — Z7189 Other specified counseling: Secondary | ICD-10-CM | POA: Insufficient documentation

## 2020-01-28 DIAGNOSIS — Z0000 Encounter for general adult medical examination without abnormal findings: Secondary | ICD-10-CM

## 2020-01-28 NOTE — Assessment & Plan Note (Deleted)
Chronic History of recurrent UTIs Takes Macrobid daily as needed

## 2020-01-28 NOTE — Assessment & Plan Note (Signed)
Chronic Managed by GYN-DEXA up-to-date Taking calcium and vitamin D daily Continue regular exercise

## 2020-01-28 NOTE — Assessment & Plan Note (Deleted)
Chronic Thyroid nodule has been stable for a number of years and no follow-up is needed We will check TSH

## 2020-01-28 NOTE — Assessment & Plan Note (Signed)
Discussed concerns about if infected what to do and antibody testing

## 2020-01-28 NOTE — Assessment & Plan Note (Deleted)
Chronic Well controlled Zyrtec 10 mg daily and Flonase daily as needed

## 2020-01-28 NOTE — Assessment & Plan Note (Signed)
Chronic, intermittent Continue gabapentin 100 mg at night prn

## 2020-01-29 NOTE — Addendum Note (Signed)
Addended by: Marcina Millard on: 01/29/2020 04:11 PM   Modules accepted: Orders

## 2020-02-04 ENCOUNTER — Ambulatory Visit: Payer: 59 | Admitting: Podiatry

## 2020-02-19 ENCOUNTER — Ambulatory Visit (INDEPENDENT_AMBULATORY_CARE_PROVIDER_SITE_OTHER): Payer: 59

## 2020-02-19 ENCOUNTER — Other Ambulatory Visit: Payer: Self-pay

## 2020-02-19 DIAGNOSIS — Z23 Encounter for immunization: Secondary | ICD-10-CM

## 2020-07-01 ENCOUNTER — Other Ambulatory Visit: Payer: Self-pay

## 2020-07-01 ENCOUNTER — Ambulatory Visit (INDEPENDENT_AMBULATORY_CARE_PROVIDER_SITE_OTHER): Payer: 59

## 2020-07-01 DIAGNOSIS — Z23 Encounter for immunization: Secondary | ICD-10-CM

## 2020-07-22 ENCOUNTER — Other Ambulatory Visit: Payer: Self-pay

## 2020-07-22 NOTE — Progress Notes (Signed)
Subjective:    Patient ID: Veronica Chen, female    DOB: Nov 08, 1965, 55 y.o.   MRN: [id number]  HPI The patient is here for an acute visit.   Right lower back pain radiating down right leg started about a year and a half ago.  She always has some constant symptoms, but most days they are very tolerable and they do not interfere with any activities.  She does get these occasional flares and the pain is quite painful now.  She has taken gabapentin in the past and that did help.  She is currently having significant pain.  She thinks it started after wearing heels.  She knows wearing heels or sitting for long periods of time can flare her pain.  She cannot cross her legs because it will cause pain.  She has been taking Advil-Aleve seems to work better.  She was been using Biofreeze.  She really wants to get some relief.  She is very active and wants to continue to be active.     Medications and allergies reviewed with patient and updated if appropriate.  Patient Active Problem List   Diagnosis Date Noted  . Lumbar radiculopathy 07/23/2020  . Educated about COVID-19 virus infection 01/28/2020  . UTI (urinary tract infection) 02/21/2019  . Finger pain, right 04/03/2018  . Sciatica of left side 12/27/2017  . GERD (gastroesophageal reflux disease) 12/27/2017  . Hearing loss of left ear 10/12/2016  . Low back pain 12/14/2015  . Vertigo 04/20/2015  . History of chronic urinary tract infection 03/31/2014  . Capsulitis of foot 02/04/2013  . Sesamoiditis 02/04/2013  . Allergic rhinitis   . Osteopenia   . THYROID NODULE 03/23/2010  . Protein S deficiency ([place]) 03/23/2010  . ECZEMA 03/23/2010    Current Outpatient Medications on File Prior to Visit  Medication Sig Dispense Refill  . albuterol (PROVENTIL HFA;VENTOLIN HFA) 108 (90 Base) MCG/ACT inhaler Inhale 2 puffs into the lungs every 6 (six) hours as needed for wheezing or shortness of breath. 1 Inhaler 2  . Ascorbic Acid (VITAMIN  C) 100 MG tablet Take 100 mg by mouth daily.    Marland Kitchen aspirin 81 MG tablet Take 81 mg by mouth daily.    Marland Kitchen b complex vitamins tablet Take 1 tablet by mouth daily.    . Fluocinolone Acetonide 0.01 % OIL Instill 4 drops into both ears every night for 14 days. Then stop and use as needed for itching.    . fluticasone (FLONASE) 50 MCG/ACT nasal spray Place 2 sprays into both nostrils daily. 16 g 6  . loratadine (CLARITIN) 10 MG tablet Take 10 mg by mouth daily.    Marland Kitchen MAGNESIUM PO Take by mouth.    . metroNIDAZOLE (METROCREAM) 0.75 % cream SMARTSIG:1 Application Topical 1 to 2 Times Daily    . nitrofurantoin, macrocrystal-monohydrate, (MACROBID) 100 MG capsule Take 100 mg by mouth daily as needed.    Marland Kitchen VITAMIN D, CHOLECALCIFEROL, PO Take by mouth.    Marland Kitchen VITAMIN E PO Take by mouth.     No current facility-administered medications on file prior to visit.    Past Medical History:  Diagnosis Date  . 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 ECZEMA   . Palpitations   . Protein S deficiency ([place])   . PVC's (premature ventricular contractions)   . THYROID NODULE   . Vertigo     Past Surgical History:  Procedure Laterality  Date  . CESAREAN SECTION     x2  . uterine ablation      Social History   Socioeconomic History  . Marital status: Married    Spouse name: Not on file  . Number of children: 2  . Years of education: college 2  . Highest education level: Not on file  Occupational History  . 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 Smoking status: Never Smoker  . Smokeless tobacco: Never Used  . 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 Alcohol use: No  . Drug use: No  . Sexual activity: Not on file  Other Topics Concern  . Not on file  Social History Narrative   Patient lives at home with husband [name]    Patient has 2 children.     Patient has a college education.    Patient is right handed.    Social Determinants of Health   Financial Resource Strain: Not on file  Food Insecurity: Not on file  Transportation Needs: Not on file  Physical Activity: Not on file  Stress: Not on file  Social Connections: Not on file    Family History  Problem Relation Age of Onset  . Other Mother        parathyroid  . Clotting disorder Father   . Heart attack Unknown     Review of Systems  Constitutional: Negative for fever.  Musculoskeletal: Positive for back pain (Right lower back with with radiation down right leg). Negative for myalgias.  Neurological: Negative for weakness and numbness.       Objective:   Vitals:   07/23/20 1058  BP: 110/62  Pulse: 81  Temp: 98.6 F (37 C)  SpO2: 98%   BP Readings from Last 3 Encounters:  07/23/20 110/62  01/28/20 116/78  02/21/19 122/80   Wt Readings from Last 3 Encounters:  07/23/20 135 lb (61.2 kg)  01/28/20 136 lb (61.7 kg)  02/21/19 136 lb (61.7 kg)   Body mass index is 26.37 kg/m.   Physical Exam Constitutional:      General: She is not in acute distress.    Appearance: Normal appearance. She is not ill-appearing.  Musculoskeletal:        General: Tenderness (Right lower back just next to lumbar spine) present. No deformity.     Right lower leg: No edema.     Left lower leg: No edema.  Skin:    General: Skin is warm and dry.  Neurological:     Mental Status: She is alert.     Sensory: No sensory deficit.     Motor: No weakness.     Gait: Gait normal.            Assessment & Plan:    See Problem List for Assessment and Plan of chronic medical problems.    This visit occurred during the SARS-CoV-2 public health emergency.  Safety protocols were in place, including screening questions prior to the visit, additional usage of staff PPE, and extensive cleaning of exam room while observing appropriate contact time as indicated for disinfecting  solutions.

## 2020-07-23 ENCOUNTER — Ambulatory Visit: Payer: 59 | Admitting: Internal Medicine

## 2020-07-23 ENCOUNTER — Encounter: Payer: Self-pay | Admitting: Internal Medicine

## 2020-07-23 VITALS — BP 110/62 | HR 81 | Temp 98.6°F | Ht 60.0 in | Wt 135.0 lb

## 2020-07-23 DIAGNOSIS — M5416 Radiculopathy, lumbar region: Secondary | ICD-10-CM

## 2020-07-23 MED ORDER — GABAPENTIN 100 MG PO CAPS: 100.0000 mg | ORAL_CAPSULE | Freq: Every evening | ORAL | 5 refills | Status: DC | PRN

## 2020-07-23 MED ORDER — METHYLPREDNISOLONE 4 MG PO TBPK: ORAL_TABLET | ORAL | 0 refills | Status: DC

## 2020-07-23 NOTE — Patient Instructions (Addendum)
  Medications changes include :   Start gabapentin at bedtime.  Take the steroid as prescribed.     Your prescription(s) have been submitted to your pharmacy. Please take as directed and contact our office if you believe you are having problem(s) with the medication(s).   A referral was ordered for [facility].      Someone from their office will call you to schedule an appointment.

## 2020-07-23 NOTE — Assessment & Plan Note (Addendum)
Acute on chronic Symptoms for a year and a half-waxing and waning and now she has a flare and her symptoms are severe Right lower back pain with radiation down right leg Gabapentin in the past has been helpful-restart gabapentin 100-200 mg at bedtime.  Can increase up to 300 mg if needed Medrol Dosepak She will continue Aleve once she completes the steroid Referral ordered for orthopedics for further evaluation and treatment-we will hold off on imaging until she sees Ortho

## 2020-07-28 ENCOUNTER — Ambulatory Visit: Payer: Self-pay

## 2020-07-28 ENCOUNTER — Encounter: Payer: Self-pay | Admitting: Family Medicine

## 2020-07-28 ENCOUNTER — Ambulatory Visit: Payer: 59 | Admitting: Family Medicine

## 2020-07-28 DIAGNOSIS — G8929 Other chronic pain: Secondary | ICD-10-CM | POA: Diagnosis not present

## 2020-07-28 DIAGNOSIS — M5441 Lumbago with sciatica, right side: Secondary | ICD-10-CM
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
## 2020-07-28 NOTE — Patient Instructions (Signed)
    Over the counter:  - Glucosamine Sulfate 1,000 mg twice daily  - Turmeric:  500 mg twice daily   Do a Google search for "anti-inflammatory diet".

## 2020-07-28 NOTE — Progress Notes (Signed)
I saw and examined the patient with Dr. Elouise Munroe and agree with assessment and plan as outlined.    LBP with DDD and DJD, recent radicular pain subsided.  Will refer to PT.  Try glucosamine and turmeric, anti-inflammatory diet.

## 2020-09-07 ENCOUNTER — Other Ambulatory Visit: Payer: Self-pay

## 2020-09-07 ENCOUNTER — Ambulatory Visit: Payer: 59 | Admitting: Podiatry

## 2020-09-07 DIAGNOSIS — Q666 Other congenital valgus deformities of feet: Secondary | ICD-10-CM

## 2020-09-07 DIAGNOSIS — M7752 Other enthesopathy of left foot: Secondary | ICD-10-CM

## 2020-09-07 LAB — HM DEXA SCAN

## 2020-09-08 ENCOUNTER — Encounter: Payer: Self-pay | Admitting: Podiatry

## 2020-09-08 NOTE — Progress Notes (Signed)
Subjective:  Patient ID: Veronica Chen, female    DOB: 11/08/1965,  MRN: [id number]  Chief Complaint  Patient presents with  . Foot Pain    Left foot pain. PT stated that she is still having pain with her left foot     55 y.o. female presents with the above complaint.  Patient presents with follow-up of left submetatarsal 2 metatarsophalangeal joint pain.  Patient states that she had an injection which helped considerably.  However the pain has come back again in the same place.  She would like to do if she can do another injection.  She denies any other acute complaints.   Review of Systems: Negative except as noted in the HPI. Denies N/V/F/Ch.  Past Medical History:  Diagnosis Date  . 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 ECZEMA   . Palpitations   . Protein S deficiency ([facility])   . PVC's (premature ventricular contractions)   . THYROID NODULE   . Vertigo     Current Outpatient Medications:  .  albuterol (PROVENTIL HFA;VENTOLIN HFA) 108 (90 Base) MCG/ACT inhaler, Inhale 2 puffs into the lungs every 6 (six) hours as needed for wheezing or shortness of breath., Disp: 1 Inhaler, Rfl: 2 .  Ascorbic Acid (VITAMIN C) 100 MG tablet, Take 100 mg by mouth daily., Disp: , Rfl:  .  aspirin 81 MG tablet, Take 81 mg by mouth daily., Disp: , Rfl:  .  b complex vitamins tablet, Take 1 tablet by mouth daily., Disp: , Rfl:  .  Fluocinolone Acetonide 0.01 % OIL, Instill 4 drops into both ears every night for 14 days. Then stop and use as needed for itching., Disp: , Rfl:  .  fluticasone (FLONASE) 50 MCG/ACT nasal spray, Place 2 sprays into both nostrils daily., Disp: 16 g, Rfl: 6 .  gabapentin (NEURONTIN) 100 MG capsule, Take 1-3 capsules (100-300 mg total) by mouth at bedtime as needed., Disp: 90 capsule, Rfl: 5 .  loratadine (CLARITIN) 10 MG tablet, Take 10 mg by mouth daily., Disp: , Rfl:  .  MAGNESIUM PO, Take by mouth., Disp: , Rfl:  .   methylPREDNISolone (MEDROL DOSEPAK) 4 MG TBPK tablet, 24 mg PO on day 1, then decr. by 4 mg/day x5 days, Disp: 21 tablet, Rfl: 0 .  metroNIDAZOLE (METROCREAM) 0.75 % cream, SMARTSIG:1 Application Topical 1 to 2 Times Daily, Disp: , Rfl:  .  nitrofurantoin, macrocrystal-monohydrate, (MACROBID) 100 MG capsule, Take 100 mg by mouth daily as needed., Disp: , Rfl:  .  VITAMIN D, CHOLECALCIFEROL, PO, Take by mouth., Disp: , Rfl:  .  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 There is no height or weight on file to calculate BMI. Constitutional Well developed. Well nourished.  Vascular Dorsalis pedis pulses palpable bilaterally. Posterior tibial pulses palpable bilaterally. Capillary refill normal to all digits.  No cyanosis or clubbing noted. Pedal hair growth normal.  Neurologic Normal speech. Oriented to person, place, and time. Epicritic sensation to light touch grossly present bilaterally.  Dermatologic Nails well groomed and normal in appearance. No open wounds. No skin lesions.  Orthopedic:  Pain on palpation to the  left second MPJ.  No pain with range of motion of the MPJ.  No intra-articular pain at the MPJ noted.  Pain on the plantar aspect of the left second metatarsophalangeal joint.  Positive Lachman stress test with a possible attenuation of the plantar plate.   Radiographs: 3 views of skeletally mature adult left foot: There is decreasing calcaneal inclination angle anterior break in the cyma line increase in talar declination angle mild elevatus of the first ray noted.  There is plantar and posterior heel spurring noted.  No other bony abnormalities identified.  There is decrease in first metatarsophalangeal joint spacing  consistent with beginning arthritic changes.  Assessment:   1. Capsulitis of metatarsophalangeal (MTP) joint of left foot   2. Pes planovalgus    Plan:  Patient was evaluated and treated and all questions answered.  Left submet 2 metatarsophalangeal joint capsulitis secondary to plantar plate injury -I explained the patient the etiology of the joint and various treatment options were extensively discussed.  Given the amount of pain that she is having likely due to the attenuation of the plantar plate as opposed to capsulitis/intra-articular of metatarsophalangeal joint, I believe patient will benefit from a steroid injection to help decrease inflammatory component associated with pain.  Patient agrees with the plan would like to proceed with a steroid injection. -A second steroid injection was performed at left second metatarsophalangeal joint using 1% plain Lidocaine and 10 mg of Kenalog. This was well tolerated.  Pes planovalgus 0 I explained to the patient and the etiology of pes planovalgus and worse treatment options were discussed.  Ultimately since he is putting a lot of forefoot metatarsalgia pain and symptoms I believe she will benefit from offloading the submetatarsal 2 with incorporation of the metatarsal pad into the orthotics. -She was casted for orthotics.  No follow-ups on file.

## 2020-09-09 NOTE — Progress Notes (Signed)
Subjective:    Patient ID: Veronica Chen, female    DOB: 1966-02-08, 55 y.o.   MRN: [id number]  HPI The patient is here for an acute visit to discuss abnormal labs.  Had blood work done by [facility] and she is here to review that as advised by them.    Only abnormal labs below -   TC - 210 Trigs - 198 HDL - 63 LDL 113      Medications and allergies reviewed with patient and updated if appropriate.  Patient Active Problem List   Diagnosis Date Noted  . Lumbar radiculopathy 07/23/2020  . Educated about COVID-19 virus infection 01/28/2020  . UTI (urinary tract infection) 02/21/2019  . Finger pain, right 04/03/2018  . Sciatica of left side 12/27/2017  . GERD (gastroesophageal reflux disease) 12/27/2017  . Hearing loss of left ear 10/12/2016  . Low back pain 12/14/2015  . Vertigo 04/20/2015  . History of chronic urinary tract infection 03/31/2014  . Capsulitis of foot 02/04/2013  . Sesamoiditis 02/04/2013  . Allergic rhinitis   . Osteopenia   . THYROID NODULE 03/23/2010  . Protein S deficiency ([place]) 03/23/2010  . ECZEMA 03/23/2010    Current Outpatient Medications on File Prior to Visit  Medication Sig Dispense Refill  . albuterol (PROVENTIL HFA;VENTOLIN HFA) 108 (90 Base) MCG/ACT inhaler Inhale 2 puffs into the lungs every 6 (six) hours as needed for wheezing or shortness of breath. 1 Inhaler 2  . Ascorbic Acid (VITAMIN C) 100 MG tablet Take 100 mg by mouth daily.    Marland Kitchen aspirin 81 MG tablet Take 81 mg by mouth daily.    Marland Kitchen b complex vitamins tablet Take 1 tablet by mouth daily.    . Fluocinolone Acetonide 0.01 % OIL Instill 4 drops into both ears every night for 14 days. Then stop and use as needed for itching.    . fluticasone (FLONASE) 50 MCG/ACT nasal spray Place 2 sprays into both nostrils daily. 16 g 6  . gabapentin (NEURONTIN) 100 MG capsule Take 1-3 capsules (100-300 mg total) by mouth at bedtime as needed. 90 capsule 5  . loratadine (CLARITIN) 10 MG tablet Take 10  mg by mouth daily.    Marland Kitchen MAGNESIUM PO Take by mouth.    . metroNIDAZOLE (METROCREAM) 0.75 % cream SMARTSIG:1 Application Topical 1 to 2 Times Daily    . VITAMIN D, CHOLECALCIFEROL, PO Take by mouth.    Marland Kitchen VITAMIN E PO Take by mouth.     No current facility-administered medications on file prior to visit.    Past Medical History:  Diagnosis Date  . 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 ECZEMA   . Palpitations   . Protein S deficiency ([facility])   . PVC's (premature ventricular contractions)   . THYROID NODULE   . Vertigo     Past Surgical History:  Procedure Laterality Date  . CESAREAN SECTION     x2  . uterine ablation      Social History   Socioeconomic History  . Marital status: Married    Spouse name: Not on file  . Number of children: 2  . Years of education: college 2  . Highest education level: Not on file  Occupational History  . 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 Smoking status: Never Smoker  . Smokeless tobacco: Never Used  . 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 Alcohol use: No  . Drug use: No  . Sexual activity: Not on file  Other Topics Concern  . Not on file  Social History Narrative   Patient lives at home with husband [name]    Patient has 2 children.    Patient has a college education.    Patient is right handed.    Social Determinants of Health   Financial Resource Strain: Not on file  Food Insecurity: Not on file  Transportation Needs: Not on file  Physical Activity: Not on file  Stress: Not on file  Social Connections: Not on file    Family History  Problem Relation Age of Onset  . Other Mother        parathyroid  . Clotting disorder Father   . Heart attack Unknown     Review of Systems     Objective:   Vitals:   09/10/20 0829  BP: 106/72  Pulse: 80  Temp: 98 F  (36.7 C)  SpO2: 98%   BP Readings from Last 3 Encounters:  09/10/20 106/72  07/23/20 110/62  01/28/20 116/78   Wt Readings from Last 3 Encounters:  09/10/20 135 lb 6.4 oz (61.4 kg)  07/23/20 135 lb (61.2 kg)  01/28/20 136 lb (61.7 kg)   Body mass index is 26.44 kg/m.   Physical Exam         Assessment & Plan:    See Problem List for Assessment and Plan of chronic medical problems.    This visit occurred during the SARS-CoV-2 public health emergency.  Safety protocols were in place, including screening questions prior to the visit, additional usage of staff PPE, and extensive cleaning of exam room while observing appropriate contact time as indicated for disinfecting solutions.

## 2020-09-10 ENCOUNTER — Ambulatory Visit: Payer: 59 | Admitting: Internal Medicine

## 2020-09-10 ENCOUNTER — Other Ambulatory Visit: Payer: Self-pay

## 2020-09-10 ENCOUNTER — Encounter: Payer: Self-pay | Admitting: Internal Medicine

## 2020-09-10 DIAGNOSIS — E781 Pure hyperglyceridemia: Secondary | ICD-10-CM | POA: Diagnosis not present

## 2020-09-10 NOTE — Assessment & Plan Note (Signed)
New Recent blood work done at gynecologist office shows dyslipidemia-elevated triglycerides, elevated LDL and slightly elevated total cholesterol Reviewed cholesterol numbers from 2019, which were much better Discussed causes of elevated triglycerides, LDL and total cholesterol She is exercising regularly, she does not drink alcohol and does not smoke.  She is eating healthy and has eliminated most sugars from her diet I do not think that this lipid panel is significant and I think if we were to retest her it would be better.  We discussed this and deferred for now Her ASCVD risk is 1%  No further evaluation necessary Will do routine lipid screening with her physical or with her gynecologist

## 2020-09-27 ENCOUNTER — Telehealth: Payer: Self-pay | Admitting: Podiatry

## 2020-09-27 NOTE — Telephone Encounter (Signed)
Orthotics in.. lvm for pt to call to schedule an appt to pick them up. °

## 2020-10-13 ENCOUNTER — Other Ambulatory Visit: Payer: Self-pay

## 2020-10-13 ENCOUNTER — Ambulatory Visit (INDEPENDENT_AMBULATORY_CARE_PROVIDER_SITE_OTHER): Payer: 59 | Admitting: Podiatry

## 2020-10-13 ENCOUNTER — Encounter: Payer: Self-pay | Admitting: Internal Medicine

## 2020-10-13 DIAGNOSIS — M7752 Other enthesopathy of left foot: Secondary | ICD-10-CM

## 2020-10-13 DIAGNOSIS — Q666 Other congenital valgus deformities of feet: Secondary | ICD-10-CM

## 2020-10-13 NOTE — Patient Instructions (Signed)

## 2020-10-13 NOTE — Progress Notes (Signed)
Patient presents today for orthotic pick up  Orthotics were fitted to patient's feet. Patient stated that on the right orthotic the metatarsal pad was hurting her foot and really did not know why there was a pad in there and would like to have it removed and I stated that we would send the orthotic back and call when the orthotic is ready for pick up.

## 2020-10-19 ENCOUNTER — Telehealth: Payer: Self-pay | Admitting: Podiatry

## 2020-10-19 NOTE — Telephone Encounter (Signed)
Adjusted orthotics in.. lvm for pt ok to pick up or she could wait to pick them up at her appt on 6.29.2022.

## 2020-10-20 ENCOUNTER — Ambulatory Visit: Payer: 59 | Admitting: Podiatry

## 2020-11-01 ENCOUNTER — Telehealth: Payer: Self-pay | Admitting: Podiatry

## 2020-11-01 NOTE — Telephone Encounter (Signed)
Pt left message on 7.6 when I was out of the office stating her orthotics are in per a phone call and to call to schedule an appt.  I returned call and left message for pt to call back to schedule an appt.

## 2020-11-04 ENCOUNTER — Telehealth: Payer: Self-pay | Admitting: Podiatry

## 2020-11-04 NOTE — Telephone Encounter (Signed)
Pt left message yesterday returning my call about orthotics.  I called back and the next available appt is not until 7.26 and pt was instructed previously on how to wear them. She stated there was a bump in the orthotic that was not suppose to be there that is why they were sent back. She is ok with just picking them up and if any issues will call and get an appt.

## 2021-01-03 ENCOUNTER — Other Ambulatory Visit: Payer: Self-pay | Admitting: Podiatry

## 2021-01-03 DIAGNOSIS — M79672 Pain in left foot: Secondary | ICD-10-CM

## 2021-02-23 ENCOUNTER — Other Ambulatory Visit: Payer: Self-pay

## 2021-02-23 ENCOUNTER — Ambulatory Visit: Payer: 59 | Admitting: Podiatry

## 2021-02-23 DIAGNOSIS — M7752 Other enthesopathy of left foot: Secondary | ICD-10-CM | POA: Diagnosis not present

## 2021-02-23 DIAGNOSIS — S99922A Unspecified injury of left foot, initial encounter: Secondary | ICD-10-CM

## 2021-02-23 NOTE — Progress Notes (Signed)
Subjective:  Patient ID: Veronica Chen, female    DOB: 1966-03-08,  MRN: [id number]  Chief Complaint  Patient presents with   Foot Pain    Left foot pain PT stated that she is still having some pain in her 2nd toe joint     55 y.o. female presents with the above complaint.  Patient presents with follow-up of left submetatarsal 2 metatarsophalangeal joint pain.  Patient states that she had an injection which helped considerably.  However the pain has come back again in the same place.  She would like to do if she can do another injection.  She denies any other acute complaints.   Review of Systems: Negative except as noted in the HPI. Denies N/V/F/Ch.  Past Medical History:  Diagnosis Date   Congenital deficiency of other clotting factors    prot s defic, no hx DVT/VTE   ECZEMA    Palpitations    Protein S deficiency ([facility])    PVC's (premature ventricular contractions)    THYROID NODULE    Vertigo     Current Outpatient Medications:    albuterol (PROVENTIL HFA;VENTOLIN HFA) 108 (90 Base) MCG/ACT inhaler, Inhale 2 puffs into the lungs every 6 (six) hours as needed for wheezing or shortness of breath., Disp: 1 Inhaler, Rfl: 2   Ascorbic Acid (VITAMIN C) 100 MG tablet, Take 100 mg by mouth daily., Disp: , Rfl:    aspirin 81 MG tablet, Take 81 mg by mouth daily., Disp: , Rfl:    b complex vitamins tablet, Take 1 tablet by mouth daily., Disp: , Rfl:    Fluocinolone Acetonide 0.01 % OIL, Instill 4 drops into both ears every night for 14 days. Then stop and use as needed for itching., Disp: , Rfl:    fluticasone (FLONASE) 50 MCG/ACT nasal spray, Place 2 sprays into both nostrils daily., Disp: 16 g, Rfl: 6   gabapentin (NEURONTIN) 100 MG capsule, Take 1-3 capsules (100-300 mg total) by mouth at bedtime as needed., Disp: 90 capsule, Rfl: 5   loratadine (CLARITIN) 10 MG tablet, Take 10 mg by mouth daily., Disp: , Rfl:    MAGNESIUM PO, Take by mouth., Disp: , Rfl:    metroNIDAZOLE  (METROCREAM) 0.75 % cream, SMARTSIG:1 Application Topical 1 to 2 Times Daily, Disp: , Rfl:    VITAMIN D, CHOLECALCIFEROL, PO, Take by mouth., Disp: , Rfl:    VITAMIN E PO, Take by mouth., Disp: , Rfl:   Social History   Tobacco Use  Smoking Status Never  Smokeless Tobacco Never  Tobacco Comments   Married, 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 There is no height or weight on file to calculate BMI. Constitutional Well developed. Well nourished.  Vascular Dorsalis pedis pulses palpable bilaterally. Posterior tibial pulses palpable bilaterally. Capillary refill normal to all digits.  No cyanosis or clubbing noted. Pedal hair growth normal.  Neurologic Normal speech. Oriented to person, place, and time. Epicritic sensation to light touch grossly present bilaterally.  Dermatologic Nails well groomed and normal in appearance. No open wounds. No skin lesions.  Orthopedic:  Pain on palpation to the left second MPJ.  No pain with range of motion of the MPJ.  No intra-articular pain at the MPJ noted.  Pain on the plantar aspect of the left second metatarsophalangeal joint.  Positive Lachman stress test with a possible attenuation of the plantar plate.  Radiographs: 3 views of skeletally mature adult left foot: There is decreasing calcaneal inclination angle anterior break in the cyma line increase in talar declination angle mild elevatus of the first ray noted.  There is plantar and posterior heel spurring noted.  No other bony abnormalities identified.  There is decrease in first metatarsophalangeal joint spacing consistent with beginning arthritic changes.  Assessment:   1. Capsulitis of metatarsophalangeal (MTP) joint of left foot   2. Injury of plantar plate of left foot, initial encounter     Plan:  Patient was evaluated and treated and all questions answered.  Left submet 2  metatarsophalangeal joint capsulitis secondary to plantar plate injury -I explained the patient the etiology of the joint and various treatment options were extensively discussed.   -Clinically at this point patient could have capsulitis versus plantar plate injury given that has not gotten better we will hold off on any further injection.  I believe patient will benefit from MRI and MRI evaluation. -MRI was ordered -She also has a history of protein S deficiency  Pes planovalgus 0 I explained to the patient and the etiology of pes planovalgus and worse treatment options were discussed.  Ultimately since he is putting a lot of forefoot metatarsalgia pain and symptoms I believe she will benefit from offloading the submetatarsal 2 with incorporation of the metatarsal pad into the orthotics. -Patient is obtain orthotics and functioning okay in them.  No follow-ups on file.

## 2021-03-13 ENCOUNTER — Ambulatory Visit
Admission: RE | Admit: 2021-03-13 | Discharge: 2021-03-13 | Disposition: A | Discharge status: Home / Self Care | Payer: 59 | Source: Ambulatory Visit | Attending: Podiatry | Admitting: Podiatry

## 2021-03-13 ENCOUNTER — Other Ambulatory Visit: Payer: Self-pay

## 2021-03-13 DIAGNOSIS — M7752 Other enthesopathy of left foot: Secondary | ICD-10-CM

## 2021-03-13 DIAGNOSIS — S99922A Unspecified injury of left foot, initial encounter: Secondary | ICD-10-CM

## 2021-04-06 ENCOUNTER — Other Ambulatory Visit: Payer: Self-pay

## 2021-04-06 ENCOUNTER — Encounter: Payer: Self-pay | Admitting: Podiatry

## 2021-04-06 ENCOUNTER — Ambulatory Visit: Payer: 59 | Admitting: Podiatry

## 2021-04-06 DIAGNOSIS — M7752 Other enthesopathy of left foot: Secondary | ICD-10-CM

## 2021-04-06 NOTE — Progress Notes (Signed)
Subjective:  Patient ID: Veronica Chen, female    DOB: [birth year],  MRN: [id number]  Chief Complaint  Patient presents with   Toe Pain    Left foot 2nd toe pain  Pt stated that it is about the same     55 y.o. female presents with the above complaint.  Patient presents with follow-up of left submetatarsal 2 metatarsophalangeal joint pain.  Patient states that she had an injection which helped considerably.  She states she is hurting.  She would like to get more on the routine schedule to get steroid injection every 3 to 4 months.  She does not want to go under surgery at this time.  She is also here to go over her MRI as well.   Review of Systems: Negative except as noted in the HPI. Denies N/V/F/Ch.  Past Medical History:  Diagnosis Date   Congenital deficiency of other clotting factors    prot s defic, no hx DVT/VTE   ECZEMA    Palpitations    Protein S deficiency (HCC)    PVC's (premature ventricular contractions)    THYROID NODULE    Vertigo     Current Outpatient Medications:    albuterol (PROVENTIL HFA;VENTOLIN HFA) 108 (90 Base) MCG/ACT inhaler, Inhale 2 puffs into the lungs every 6 (six) hours as needed for wheezing or shortness of breath., Disp: 1 Inhaler, Rfl: 2   Ascorbic Acid (VITAMIN C) 100 MG tablet, Take 100 mg by mouth daily., Disp: , Rfl:    aspirin 81 MG tablet, Take 81 mg by mouth daily., Disp: , Rfl:    b complex vitamins tablet, Take 1 tablet by mouth daily., Disp: , Rfl:    Fluocinolone Acetonide 0.01 % OIL, Instill 4 drops into both ears every night for 14 days. Then stop and use as needed for itching., Disp: , Rfl:    fluticasone (FLONASE) 50 MCG/ACT nasal spray, Place 2 sprays into both nostrils daily., Disp: 16 g, Rfl: 6   gabapentin (NEURONTIN) 100 MG capsule, Take 1-3 capsules (100-300 mg total) by mouth at bedtime as needed., Disp: 90 capsule, Rfl: 5   loratadine (CLARITIN) 10 MG tablet, Take 10 mg by mouth daily., Disp: , Rfl:    MAGNESIUM PO, Take  by mouth., Disp: , Rfl:    metroNIDAZOLE (METROCREAM) 0.75 % cream, SMARTSIG:1 Application Topical 1 to 2 Times Daily, Disp: , Rfl:    VITAMIN D, CHOLECALCIFEROL, PO, Take by mouth., Disp: , Rfl:    VITAMIN E PO, Take by mouth., Disp: , Rfl:   Social History   Tobacco Use  Smoking Status Never  Smokeless Tobacco Never  Tobacco Comments   Married, 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 There is no height or weight on file to calculate BMI. Constitutional Well developed. Well nourished.  Vascular Dorsalis pedis pulses palpable bilaterally. Posterior tibial pulses palpable bilaterally. Capillary refill normal to all digits.  No cyanosis or clubbing noted. Pedal hair growth normal.  Neurologic Normal speech. Oriented to person, place, and time. Epicritic sensation to light touch grossly present bilaterally.  Dermatologic Nails well groomed and normal in appearance. No open wounds. No skin lesions.  Orthopedic:  Pain on palpation to the left second MPJ.  No pain with range of motion of the MPJ.  No intra-articular pain at the MPJ noted.  Pain on the plantar aspect of the left second  metatarsophalangeal joint.  Positive Lachman stress test with a possible attenuation of the plantar plate.   Radiographs: 3 views of skeletally mature adult left foot: There is decreasing calcaneal inclination angle anterior break in the cyma line increase in talar declination angle mild elevatus of the first ray noted.  There is plantar and posterior heel spurring noted.  No other bony abnormalities identified.  There is decrease in first metatarsophalangeal joint spacing consistent with beginning arthritic changes.  IMPRESSION: 1. Findings suspicious for tear of the lateral collateral ligament of second digit.   2.  Plantar plates are intact.   3.  No evidence of Morton's neuroma.   4. Fluid signal  about the insertion of first and second lumbricals concerning for bursitis.    Assessment:   1. Capsulitis of metatarsophalangeal (MTP) joint of left foot   2. Bursitis of left foot      Plan:  Patient was evaluated and treated and all questions answered.  Left submet 2 metatarsophalangeal joint capsulitis secondary to bursitis -I explained the patient the etiology of the joint and various treatment options were extensively discussed.   -At this time patient does not want undergo surgery call options.  She does get relief from steroid injection.  Patient will benefit from maintenance steroid injection every 3 to 4 months as needed.  Patient agrees with plan like to proceed with that plan for now. -A steroid injection was performed at left second metatarsophalangeal joint using 1% plain Lidocaine and 10 mg of Kenalog. This was well tolerated.  -MRI was reviewed with the patient which showed possible tearing of the lateral collateral ligament of the second digit.  And possible concern for bursitis at the insertion of first and second lumbrical. -She also has a history of protein S deficiency  Pes planovalgus 0 I explained to the patient and the etiology of pes planovalgus and worse treatment options were discussed.  Ultimately since he is putting a lot of forefoot metatarsalgia pain and symptoms I believe she will benefit from offloading the submetatarsal 2 with incorporation of the metatarsal pad into the orthotics. -Patient is obtain orthotics and functioning okay in them.  No follow-ups on file.

## 2021-05-10 ENCOUNTER — Telehealth: Payer: Self-pay

## 2021-05-10 NOTE — Telephone Encounter (Signed)
Pt calling in to set up her CPE appt and was requesting to have cologuard mail to her so she can have the results at the time of her appt 05/30/21.  Please advise CB [phone]  [facility] [place] [address]

## 2021-05-11 ENCOUNTER — Other Ambulatory Visit: Payer: Self-pay

## 2021-05-11 DIAGNOSIS — Z1211 Encounter for screening for malignant neoplasm of colon: Secondary | ICD-10-CM

## 2021-05-11 NOTE — Telephone Encounter (Signed)
Cologuard ordered today via Epic.

## 2021-05-27 LAB — COLOGUARD: COLOGUARD: NEGATIVE

## 2021-05-29 ENCOUNTER — Encounter: Payer: Self-pay | Admitting: Internal Medicine

## 2021-05-29 NOTE — Patient Instructions (Addendum)
Medications changes include :   none    A referral was ordered for ENT.       Someone from their office will call you to schedule an appointment.    Please followup in 1 year    Health Maintenance, Female Adopting a healthy lifestyle and getting preventive care are important in promoting health and wellness. Ask your health care provider about: The right schedule for you to have regular tests and exams. Things you can do on your own to prevent diseases and keep yourself healthy. What should I know about diet, weight, and exercise? Eat a healthy diet  Eat a diet that includes plenty of vegetables, fruits, low-fat dairy products, and lean protein. Do not eat a lot of foods that are high in solid fats, added sugars, or sodium. Maintain a healthy weight Body mass index (BMI) is used to identify weight problems. It estimates body fat based on height and weight. Your health care provider can help determine your BMI and help you achieve or maintain a healthy weight. Get regular exercise Get regular exercise. This is one of the most important things you can do for your health. Most adults should: Exercise for at least 150 minutes each week. The exercise should increase your heart rate and make you sweat (moderate-intensity exercise). Do strengthening exercises at least twice a week. This is in addition to the moderate-intensity exercise. Spend less time sitting. Even light physical activity can be beneficial. Watch cholesterol and blood lipids Have your blood tested for lipids and cholesterol at 55 years of age, then have this test every 5 years. Have your cholesterol levels checked more often if: Your lipid or cholesterol levels are high. You are older than 55 years of age. You are at high risk for heart disease. What should I know about cancer screening? Depending on your health history and family history, you may need to have cancer screening at various ages. This may include  screening for: Breast cancer. Cervical cancer. Colorectal cancer. Skin cancer. Lung cancer. What should I know about heart disease, diabetes, and high blood pressure? Blood pressure and heart disease High blood pressure causes heart disease and increases the risk of stroke. This is more likely to develop in people who have high blood pressure readings or are overweight. Have your blood pressure checked: Every 3-5 years if you are 33-41 years of age. Every year if you are 87 years old or older. Diabetes Have regular diabetes screenings. This checks your fasting blood sugar level. Have the screening done: Once every three years after age 12 if you are at a normal weight and have a low risk for diabetes. More often and at a younger age if you are overweight or have a high risk for diabetes. What should I know about preventing infection? Hepatitis B If you have a higher risk for hepatitis B, you should be screened for this virus. Talk with your health care provider to find out if you are at risk for hepatitis B infection. Hepatitis C Testing is recommended for: Everyone born from [birth year] through 1964. Anyone with known risk factors for hepatitis C. Sexually transmitted infections (STIs) Get screened for STIs, including gonorrhea and chlamydia, if: You are sexually active and are younger than 55 years of age. You are older than 55 years of age and your health care provider tells you that you are at risk for this type of infection. Your sexual activity has changed since you were last screened,  and you are at increased risk for chlamydia or gonorrhea. Ask your health care provider if you are at risk. Ask your health care provider about whether you are at high risk for HIV. Your health care provider may recommend a prescription medicine to help prevent HIV infection. If you choose to take medicine to prevent HIV, you should first get tested for HIV. You should then be tested every 3 months for as  long as you are taking the medicine. Pregnancy If you are about to stop having your period (premenopausal) and you may become pregnant, seek counseling before you get pregnant. Take 400 to 800 micrograms (mcg) of folic acid every day if you become pregnant. Ask for birth control (contraception) if you want to prevent pregnancy. Osteoporosis and menopause Osteoporosis is a disease in which the bones lose minerals and strength with aging. This can result in bone fractures. If you are 64 years old or older, or if you are at risk for osteoporosis and fractures, ask your health care provider if you should: Be screened for bone loss. Take a calcium or vitamin D supplement to lower your risk of fractures. Be given hormone replacement therapy (HRT) to treat symptoms of menopause. Follow these instructions at home: Alcohol use Do not drink alcohol if: Your health care provider tells you not to drink. You are pregnant, may be pregnant, or are planning to become pregnant. If you drink alcohol: Limit how much you have to: 0-1 drink a day. Know how much alcohol is in your drink. In the [place] one drink equals one 12 oz bottle of beer (355 mL), one 5 oz glass of wine (148 mL), or one 1 oz glass of hard liquor (44 mL). Lifestyle Do not use any products that contain nicotine or tobacco. These products include cigarettes, chewing tobacco, and vaping devices, such as e-cigarettes. If you need help quitting, ask your health care provider. Do not use street drugs. Do not share needles. Ask your health care provider for help if you need support or information about quitting drugs. General instructions Schedule regular health, dental, and eye exams. Stay current with your vaccines. Tell your health care provider if: You often feel depressed. You have ever been abused or do not feel safe at home. Summary Adopting a healthy lifestyle and getting preventive care are important in promoting health and  wellness. Follow your health care provider's instructions about healthy diet, exercising, and getting tested or screened for diseases. Follow your health care provider's instructions on monitoring your cholesterol and blood pressure. This information is not intended to replace advice given to you by your health care provider. Make sure you discuss any questions you have with your health care provider. Document Revised: 08/30/2020 Document Reviewed: 08/30/2020 Elsevier Patient Education  [place].

## 2021-05-29 NOTE — Progress Notes (Signed)
Subjective:    Patient ID: Veronica Chen, female    DOB: 10/29/1965, 56 y.o.   MRN: [id number]   This visit occurred during the SARS-CoV-2 public health emergency.  Safety protocols were in place, including screening questions prior to the visit, additional usage of staff PPE, and extensive cleaning of exam room while observing appropriate contact time as indicated for disinfecting solutions.    HPI She is here for a physical exam.    Body pain is much better after stopping sugar.  She has been consistent with it and it has helped.     Choking experience once that was severe.  She gets very dry in her mouth - if she is taking a lot and has not drank water it feels like her throat is closing.  She uses the inhaler at times and it helps.       Medications and allergies reviewed with patient and updated if appropriate.  Patient Active Problem List   Diagnosis Date Noted   Hypertriglyceridemia 09/10/2020   Lumbar radiculopathy 07/23/2020   Finger pain, right 04/03/2018   Sciatica of left side 12/27/2017   GERD (gastroesophageal reflux disease) 12/27/2017   Hearing loss of left ear 10/12/2016   Low back pain 12/14/2015   Vertigo 04/20/2015   History of chronic urinary tract infection 03/31/2014   Capsulitis of foot 02/04/2013   Sesamoiditis 02/04/2013   Allergic rhinitis    Osteopenia    THYROID NODULE 03/23/2010   Protein S deficiency ([place]) 03/23/2010   ECZEMA 03/23/2010    Current Outpatient Medications on File Prior to Visit  Medication Sig Dispense Refill   albuterol (PROVENTIL HFA;VENTOLIN HFA) 108 (90 Base) MCG/ACT inhaler Inhale 2 puffs into the lungs every 6 (six) hours as needed for wheezing or shortness of breath. 1 Inhaler 2   Ascorbic Acid (VITAMIN C) 100 MG tablet Take 100 mg by mouth daily.     aspirin 81 MG tablet Take 81 mg by mouth daily.     b complex vitamins tablet Take 1 tablet by mouth daily.     clobetasol (OLUX) 0.05 % topical foam Apply 1  application topically daily as needed.     FINACEA 15 % gel Apply 1 application topically daily.     Fluocinolone Acetonide 0.01 % OIL Instill 4 drops into both ears every night for 14 days. Then stop and use as needed for itching.     fluticasone (FLONASE) 50 MCG/ACT nasal spray Place 2 sprays into both nostrils daily. 16 g 6   gabapentin (NEURONTIN) 100 MG capsule Take 1-3 capsules (100-300 mg total) by mouth at bedtime as needed. 90 capsule 5   imiquimod (ALDARA) 5 % cream SMARTSIG:1 Unit(s) Topical Daily     JUBLIA 10 % SOLN Apply topically daily.     loratadine (CLARITIN) 10 MG tablet Take 10 mg by mouth daily.     MAGNESIUM PO Take by mouth.     metroNIDAZOLE (METROCREAM) 0.75 % cream SMARTSIG:1 Application Topical 1 to 2 Times Daily     VITAMIN D, CHOLECALCIFEROL, PO Take by mouth.     VITAMIN E PO Take by mouth.     No current facility-administered medications on file prior to visit.    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    Patient has a college education.    Patient is right handed.    Social Determinants of Health   Financial Resource Strain: Not on file  Food Insecurity: Not on file  Transportation Needs: Not on file  Physical Activity: Not on file  Stress: Not on file  Social Connections: Not on file    Family History  Problem Relation Age of Onset   Other Mother        parathyroid   Clotting disorder Father    Heart attack Unknown     Review of Systems  Constitutional:  Negative for fever.  HENT:  Positive for trouble swallowing.   Eyes:  Negative for visual disturbance.  Respiratory:  Positive for cough (occ - related to dryness, allergies), chest tightness (occ) and wheezing (occ). Negative for shortness of breath.   Cardiovascular:  Negative for chest pain, palpitations and leg swelling.  Gastrointestinal:  Negative for abdominal pain, blood in stool, constipation, diarrhea and nausea.       Gerd much improved  Genitourinary:  Negative for dysuria.  Musculoskeletal:  Negative for arthralgias and back pain.  Skin:  Negative for rash.  Neurological:  Negative for dizziness, light-headedness and headaches.  Psychiatric/Behavioral:  Negative for dysphoric mood. The patient is not nervous/anxious.       Objective:   Vitals:   05/30/21 0821  BP: 104/78  Pulse: 78  Temp: 98.6 F (37 C)  SpO2: 98%   Filed Weights   05/30/21 0821  Weight: 128 lb 3.2 oz (58.2 kg)   Body mass index is 25.04 kg/m.  BP Readings from Last 3 Encounters:  05/30/21 104/78  09/10/20 106/72  07/23/20 110/62    Wt Readings from Last 3 Encounters:  05/30/21 128 lb 3.2 oz (58.2 kg)  09/10/20 135 lb 6.4 oz (61.4 kg)  07/23/20 135 lb (61.2 kg)    Depression screen [facility] 2/9 05/30/2021 01/28/2020 12/27/2017  Decreased Interest 0 0 0  Down, Depressed, Hopeless 0 0 0  PHQ - 2 Score 0 0 0  Altered sleeping - - 2  Tired, decreased energy - - 0  Change in appetite - - 0  Feeling bad  or failure about yourself  - - 0  Trouble concentrating - - 0  Moving slowly or fidgety/restless - - 0  Suicidal thoughts - - 0  PHQ-9 Score - - 2     No flowsheet data found.     Physical  Exam Constitutional: She appears well-developed and well-nourished. No distress.  HENT:  Head: Normocephalic and atraumatic.  Right Ear: External ear normal. Normal ear canal and TM Left Ear: External ear normal.  Normal ear canal and TM Mouth/Throat: Oropharynx is clear and moist.  Eyes: Conjunctivae and EOM are normal.  Neck: Neck supple. No tracheal deviation present. No thyromegaly present.  No carotid bruit  Cardiovascular: Normal rate, regular rhythm and normal heart sounds.   No murmur heard.  No edema. Pulmonary/Chest: Effort normal and breath sounds normal. No respiratory distress. She has no wheezes. She has no rales.  Breast: deferred   Abdominal: Soft. She exhibits no distension. There is no tenderness.  Lymphadenopathy: She has no cervical adenopathy.  Skin: Skin is warm and dry. She is not diaphoretic.  Psychiatric: She has a normal mood and affect. Her behavior is normal.     Lab Results  Component Value Date   WBC 9.3 06/06/2018   HGB 13.9 06/06/2018   HCT 41.2 06/06/2018   PLT 311.0 06/06/2018   GLUCOSE 65 (L) 06/06/2018   CHOL 171 12/27/2017   TRIG 90.0 12/27/2017   HDL 61.90 12/27/2017   LDLCALC 91 12/27/2017   ALT 10 06/06/2018   AST 14 06/06/2018   NA 139 06/06/2018   K 3.4 (L) 06/06/2018   CL 103 06/06/2018   CREATININE 0.94 06/06/2018   BUN 11 06/06/2018   CO2 27 06/06/2018   TSH 3.16 12/27/2017         Assessment & Plan:   Physical exam: Screening blood work  ordered Exercise regular Weight-good Substance abuse  none   Reviewed recommended immunizations.   Health Maintenance  Topic Date Due   Hepatitis C Screening  Never done   TETANUS/TDAP  Never done   MAMMOGRAM  04/07/2019   PAP SMEAR-Modifier  04/05/2020   COVID-19 Vaccine (4 -  Booster for [place] series) 05/08/2020   INFLUENZA VACCINE  11/22/2020   HIV Screening  12/27/2088 (Originally 10/09/1980)   DEXA SCAN  09/08/2023   Fecal DNA (Cologuard)  05/19/2024   Zoster Vaccines- Shingrix  Completed   HPV VACCINES  Aged Out          See Problem List for Assessment and Plan of chronic medical problems.

## 2021-05-30 ENCOUNTER — Ambulatory Visit (INDEPENDENT_AMBULATORY_CARE_PROVIDER_SITE_OTHER): Payer: 59 | Admitting: Internal Medicine

## 2021-05-30 ENCOUNTER — Other Ambulatory Visit: Payer: Self-pay

## 2021-05-30 VITALS — BP 104/78 | HR 78 | Temp 98.6°F | Ht 60.0 in | Wt 128.2 lb

## 2021-05-30 DIAGNOSIS — J45909 Unspecified asthma, uncomplicated: Secondary | ICD-10-CM | POA: Insufficient documentation

## 2021-05-30 DIAGNOSIS — E781 Pure hyperglyceridemia: Secondary | ICD-10-CM

## 2021-05-30 DIAGNOSIS — K219 Gastro-esophageal reflux disease without esophagitis: Secondary | ICD-10-CM | POA: Diagnosis not present

## 2021-05-30 DIAGNOSIS — M8580 Other specified disorders of bone density and structure, unspecified site: Secondary | ICD-10-CM

## 2021-05-30 DIAGNOSIS — M5441 Lumbago with sciatica, right side: Secondary | ICD-10-CM

## 2021-05-30 DIAGNOSIS — Z0000 Encounter for general adult medical examination without abnormal findings: Secondary | ICD-10-CM | POA: Diagnosis not present

## 2021-05-30 DIAGNOSIS — H6123 Impacted cerumen, bilateral: Secondary | ICD-10-CM

## 2021-05-30 DIAGNOSIS — G8929 Other chronic pain: Secondary | ICD-10-CM

## 2021-05-30 DIAGNOSIS — J4520 Mild intermittent asthma, uncomplicated: Secondary | ICD-10-CM

## 2021-05-30 DIAGNOSIS — R1310 Dysphagia, unspecified: Secondary | ICD-10-CM

## 2021-05-30 NOTE — Assessment & Plan Note (Signed)
Chronic Management per GYN-Dr. Radene Knee DEXA up-to-date

## 2021-05-30 NOTE — Assessment & Plan Note (Addendum)
Chronic Continue regular exercise and healthy diet  Had labs last year by gyn Lifestyle control

## 2021-05-30 NOTE — Assessment & Plan Note (Addendum)
Chronic Was having GERD symptoms and started otc pepcid GERD improved - ? controlled Continue pepcid 20 mg daily - has been taking for 6 weeks and GERD is much better - to see ENT and will be able to confirm it is controlled

## 2021-05-30 NOTE — Assessment & Plan Note (Signed)
Chronic, intermittent Has had 1 severe choking episode and occasionally has some mild dysphagia History of GERD and GERD improved since taking Pepcid daily for the past 6 weeks-continue Has dryness in throat-?  Related to dysphagia versus GERD Will see ENT-May need to see GI

## 2021-05-30 NOTE — Assessment & Plan Note (Signed)
Chronic Uses albuterol prn for wheeze, tightness in chest - can be triggered by dust, allergies continue

## 2021-05-30 NOTE — Assessment & Plan Note (Signed)
Chronic Intermittent Improved Continue gabapentin 100-300 mg HS prn

## 2021-06-14 ENCOUNTER — Telehealth: Payer: Self-pay | Admitting: *Deleted

## 2021-06-14 DIAGNOSIS — H6123 Impacted cerumen, bilateral: Secondary | ICD-10-CM

## 2021-06-14 DIAGNOSIS — J392 Other diseases of pharynx: Secondary | ICD-10-CM

## 2021-06-14 DIAGNOSIS — K219 Gastro-esophageal reflux disease without esophagitis: Secondary | ICD-10-CM

## 2021-06-14 NOTE — Telephone Encounter (Signed)
[facility] referral coordination from Dr. Benjamine Mola office called in reference to the referral place. They can see the patient for excessive cerumen in both ear canals but they are unable to see her for the Dysphagia. Veronica Chen is requesting a call back if you want her to still be seen for her ears. Thank you   CB: [phone]

## 2021-06-14 NOTE — Telephone Encounter (Signed)
It is more of a cough, dryness in throat - hopefully he can evaluate that

## 2021-06-14 NOTE — Telephone Encounter (Signed)
We really need to ask her-felt was a big issue-May need to send referral to [facility] ENT

## 2021-06-15 NOTE — Telephone Encounter (Signed)
Spoke with patient today and she would like to do referral for ENT instead.

## 2021-06-15 NOTE — Telephone Encounter (Signed)
New referral ordered

## 2021-07-06 ENCOUNTER — Ambulatory Visit: Payer: 59 | Admitting: Podiatry

## 2021-07-15 ENCOUNTER — Ambulatory Visit: Payer: 59 | Admitting: Podiatry

## 2021-07-15 ENCOUNTER — Other Ambulatory Visit: Payer: Self-pay

## 2021-07-15 DIAGNOSIS — M7752 Other enthesopathy of left foot: Secondary | ICD-10-CM | POA: Diagnosis not present

## 2021-07-15 NOTE — Progress Notes (Signed)
?Subjective:  ?Patient ID: Veronica Chen, female    DOB: 28-Mar-1966,  MRN: [id number] ? ?Chief Complaint  ?Patient presents with  ? Toe Pain  ?  Left foot 2nd toe pt stated that she is doing much better   ? ? ?56 y.o. female presents with the above complaint.  Patient presents for follow-up to left submetatarsal 2 metatarsophalangeal joint pain.  She states she is doing a lot better.  She does not have any pain.  She wants to know if she needs to do an injection. ? ? ?Review of Systems: Negative except as noted in the HPI. Denies N/V/F/Ch. ? ?Past Medical History:  ?Diagnosis Date  ? Congenital deficiency of other clotting factors   ? prot s defic, no hx DVT/VTE  ? ECZEMA   ? Palpitations   ? Protein S deficiency ([place])   ? PVC's (premature ventricular contractions)   ? THYROID NODULE   ? Vertigo   ? ? ?Current Outpatient Medications:  ?  albuterol (PROVENTIL HFA;VENTOLIN HFA) 108 (90 Base) MCG/ACT inhaler, Inhale 2 puffs into the lungs every 6 (six) hours as needed for wheezing or shortness of breath., Disp: 1 Inhaler, Rfl: 2 ?  Ascorbic Acid (VITAMIN C) 100 MG tablet, Take 100 mg by mouth daily., Disp: , Rfl:  ?  aspirin 81 MG tablet, Take 81 mg by mouth daily., Disp: , Rfl:  ?  b complex vitamins tablet, Take 1 tablet by mouth daily., Disp: , Rfl:  ?  clobetasol (OLUX) 0.05 % topical foam, Apply 1 application topically daily as needed., Disp: , Rfl:  ?  FINACEA 15 % gel, Apply 1 application topically daily., Disp: , Rfl:  ?  Fluocinolone Acetonide 0.01 % OIL, Instill 4 drops into both ears every night for 14 days. Then stop and use as needed for itching., Disp: , Rfl:  ?  fluticasone (FLONASE) 50 MCG/ACT nasal spray, Place 2 sprays into both nostrils daily., Disp: 16 g, Rfl: 6 ?  gabapentin (NEURONTIN) 100 MG capsule, Take 1-3 capsules (100-300 mg total) by mouth at bedtime as needed., Disp: 90 capsule, Rfl: 5 ?  imiquimod (ALDARA) 5 % cream, SMARTSIG:1 Unit(s) Topical Daily, Disp: , Rfl:  ?  JUBLIA 10 % SOLN,  Apply topically daily., Disp: , Rfl:  ?  MAGNESIUM PO, Take by mouth., Disp: , Rfl:  ?  metroNIDAZOLE (METROCREAM) 0.75 % cream, SMARTSIG:1 Application Topical 1 to 2 Times Daily, Disp: , Rfl:  ?  VITAMIN D, CHOLECALCIFEROL, PO, Take by mouth., Disp: , Rfl:  ?  VITAMIN E PO, Take by mouth., Disp: , Rfl:  ? ?Social History  ? ?Tobacco Use  ?Smoking Status Never  ?Smokeless Tobacco Never  ?Tobacco Comments  ? 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 ? ?No Known Allergies ?Objective:  ?There were no vitals filed for this visit. ?There is no height or weight on file to calculate BMI. ?Constitutional Well developed. ?Well nourished.  ?Vascular Dorsalis pedis pulses palpable bilaterally. ?Posterior tibial pulses palpable bilaterally. ?Capillary refill normal to all digits.  ?No cyanosis or clubbing noted. ?Pedal hair growth normal.  ?Neurologic Normal speech. ?Oriented to person, place, and time. ?Epicritic sensation to light touch grossly present bilaterally.  ?Dermatologic Nails well groomed and normal in appearance. ?No open wounds. ?No skin lesions.  ?Orthopedic:  Pain on palpation to the left second MPJ.  No pain with range of motion of the MPJ.  No intra-articular pain at the MPJ noted.  Pain on the plantar aspect of the left second metatarsophalangeal joint.  Positive Lachman stress test with a possible attenuation of the plantar plate.  ? ?Radiographs: 3 views of skeletally mature adult left foot: There is decreasing calcaneal inclination angle anterior break in the cyma line increase in talar declination angle mild elevatus of the first ray noted.  There is plantar and posterior heel spurring noted.  No other bony abnormalities identified.  There is decrease in first metatarsophalangeal joint spacing consistent with beginning arthritic changes. ? ?IMPRESSION: ?1. Findings suspicious for tear of the lateral collateral ligament ?of second digit. ?  ?2.  Plantar plates are intact. ?   ?3.  No evidence of Morton's neuroma. ?  ?4. Fluid signal about the insertion of first and second lumbricals ?concerning for bursitis. ?  ? ?Assessment:  ? ?1. Capsulitis of metatarsophalangeal (MTP) joint of left foot   ? ? ? ? ?Plan:  ?Patient was evaluated and treated and all questions answered. ? ?Left submet 2 metatarsophalangeal joint capsulitis secondary to bursitis ?-I explained the patient the etiology of the joint and various treatment options were extensively discussed.   ?-Clinically it has improved and resolved.  She continues to make shoe gear modification.  At this time if her pain arises have asked her to come back and see me.  She states understanding. ?-We can do injection as needed at this time. ? ?-MRI was reviewed with the patient which showed possible tearing of the lateral collateral ligament of the second digit.  And possible concern for bursitis at the insertion of first and second lumbrical. ?-She also has a history of protein S deficiency ? ?Pes planovalgus ?0 I explained to the patient and the etiology of pes planovalgus and worse treatment options were discussed.  Ultimately since he is putting a lot of forefoot metatarsalgia pain and symptoms I believe she will benefit from offloading the submetatarsal 2 with incorporation of the metatarsal pad into the orthotics. ?-Patient is obtain orthotics and functioning okay in them. ? ?No follow-ups on file.  ?

## 2021-11-02 ENCOUNTER — Encounter: Payer: Self-pay | Admitting: Family Medicine

## 2021-11-02 ENCOUNTER — Ambulatory Visit: Payer: 59 | Admitting: Family Medicine

## 2021-11-02 ENCOUNTER — Telehealth: Payer: Self-pay | Admitting: Internal Medicine

## 2021-11-02 VITALS — BP 104/72 | HR 86 | Temp 97.1°F | Ht 60.0 in | Wt 129.4 lb

## 2021-11-02 DIAGNOSIS — H6981 Other specified disorders of Eustachian tube, right ear: Secondary | ICD-10-CM

## 2021-11-02 DIAGNOSIS — R220 Localized swelling, mass and lump, head: Secondary | ICD-10-CM

## 2021-11-02 DIAGNOSIS — R21 Rash and other nonspecific skin eruption: Secondary | ICD-10-CM

## 2021-11-02 DIAGNOSIS — J0140 Acute pansinusitis, unspecified: Secondary | ICD-10-CM

## 2021-11-02 MED ORDER — METHYLPREDNISOLONE 4 MG PO TBPK: ORAL_TABLET | ORAL | 0 refills | Status: DC

## 2021-11-02 MED ORDER — AMOXICILLIN 875 MG PO TABS: 875.0000 mg | ORAL_TABLET | Freq: Two times a day (BID) | ORAL | 0 refills | Status: AC

## 2021-11-02 NOTE — Progress Notes (Signed)
Subjective:  Veronica Veronica is a 56 y.o. female who presents for possible sinus infection.  Symptoms include a 7-10 day hx of bilateral ear pressure, worse on right, facial pain worse on the right, and post nasal drainage.  She notes tingling and sensitivity to the skin on her right cheek along with swelling and a rash.  States she has had this before with her rosacea. Denies fever, chills, cough, shortness of breath, abdominal pain, N/V/D.   Took 2 days of expired Amoxicillin from her dentist.   Past history is significant for  reactive airway disease with sickness . Patient is a non-smoker.  Using Sudafed, Flonase and Afrin for symptoms.  Denies sick contacts.  No other aggravating or relieving factors.  No other c/o.  ROS as in subjective   Objective: Vitals:   11/02/21 0832  BP: 104/72  Pulse: 86  Temp: (!) 97.1 F (36.2 C)  SpO2: 93%    General appearance: Alert, WD/WN, no distress                             Skin: warm, erythema and flat-red rosacea like rash on right cheek with minimal edema of right cheek                           Head: + right maxillary sinus tenderness,                            Eyes: conjunctiva normal, corneas clear, PERRLA                            Ears: right TM with dullness and clear fluid, normal appearing left TM, external ear canals normal                          Nose: septum midline, turbinates swollen, with erythema and thick discharge             Mouth/throat: MMM, tongue normal, mild pharyngeal erythema                           Neck: supple, no adenopathy, no thyromegaly, nontender                          Heart: RRR, normal S1, S2, no murmurs                         Lungs: CTA bilaterally, no wheezes, rales, or rhonchi       Assessment and Plan:  Acute non-recurrent pansinusitis - Plan: amoxicillin (AMOXIL) 875 MG tablet, methylPREDNISolone (MEDROL DOSEPAK) 4 MG TBPK tablet  Rash of face  Eustachian tube dysfunction, right - Plan:  methylPREDNISolone (MEDROL DOSEPAK) 4 MG TBPK tablet  Right facial swelling - Plan: methylPREDNISolone (MEDROL DOSEPAK) 4 MG TBPK tablet   Prescription for Amoxicillin and Medrol dose pak. May continue with OTC Sudafed and nasal spray or congestion.  Tylenol or Ibuprofen OTC as needed. Follow up if worsening or no improvement in 3-4 days.  Strict precautions to follow up if rash on her right cheek looks more vesicular or not improving with usual rosacea treatment. Consulted with Dr. Ronnald Ramp regarding patient and he also examined  her.

## 2021-11-02 NOTE — Telephone Encounter (Signed)
Pt request ABX for possible infection due to taking new RX.   That she just received on today.            [id number]  [phone] Fax:  [phone]

## 2021-11-02 NOTE — Patient Instructions (Addendum)
Take the antibiotic and oral steroids as prescribed.  Drink plenty of water with these.  Be aware the steroids may cause flushing and insomnia also take these in the morning with a meal.  Let us know right away if you develop blisters on your face or worsening sensitivity.  Follow-up if you are getting worse or not back to baseline when you complete the antibiotic and steroids.

## 2021-12-01 ENCOUNTER — Other Ambulatory Visit: Payer: Self-pay | Admitting: Internal Medicine

## 2021-12-27 DIAGNOSIS — J0101 Acute recurrent maxillary sinusitis: Secondary | ICD-10-CM | POA: Insufficient documentation

## 2022-01-09 ENCOUNTER — Other Ambulatory Visit: Payer: Self-pay | Admitting: Physician Assistant

## 2022-01-09 DIAGNOSIS — R519 Headache, unspecified: Secondary | ICD-10-CM

## 2022-01-20 ENCOUNTER — Ambulatory Visit
Admission: RE | Admit: 2022-01-20 | Discharge: 2022-01-20 | Disposition: A | Discharge status: Home / Self Care | Payer: 59 | Source: Ambulatory Visit | Attending: Physician Assistant | Admitting: Physician Assistant

## 2022-01-20 DIAGNOSIS — R519 Headache, unspecified: Secondary | ICD-10-CM

## 2022-02-21 ENCOUNTER — Ambulatory Visit (INDEPENDENT_AMBULATORY_CARE_PROVIDER_SITE_OTHER): Payer: 59 | Admitting: *Deleted

## 2022-02-21 DIAGNOSIS — Z23 Encounter for immunization: Secondary | ICD-10-CM

## 2022-02-21 NOTE — Progress Notes (Signed)
Administered regular flu shot left deltoid. Pt tolerated well.

## 2022-05-16 ENCOUNTER — Other Ambulatory Visit: Payer: Self-pay | Admitting: Obstetrics and Gynecology

## 2022-05-16 DIAGNOSIS — Z8249 Family history of ischemic heart disease and other diseases of the circulatory system: Secondary | ICD-10-CM

## 2022-07-03 ENCOUNTER — Ambulatory Visit
Admission: RE | Admit: 2022-07-03 | Discharge: 2022-07-03 | Disposition: A | Discharge status: Home / Self Care | Payer: No Typology Code available for payment source | Source: Ambulatory Visit | Attending: Obstetrics and Gynecology | Admitting: Obstetrics and Gynecology

## 2022-07-03 DIAGNOSIS — Z8249 Family history of ischemic heart disease and other diseases of the circulatory system: Secondary | ICD-10-CM

## 2022-07-07 ENCOUNTER — Other Ambulatory Visit: Payer: Self-pay | Admitting: Obstetrics and Gynecology

## 2022-07-07 DIAGNOSIS — R911 Solitary pulmonary nodule: Secondary | ICD-10-CM

## 2022-07-18 ENCOUNTER — Ambulatory Visit: Payer: 59 | Admitting: Emergency Medicine

## 2022-07-18 ENCOUNTER — Encounter: Payer: Self-pay | Admitting: Emergency Medicine

## 2022-07-18 VITALS — BP 120/78 | HR 68 | Temp 98.0°F | Ht 60.0 in | Wt 137.0 lb

## 2022-07-18 DIAGNOSIS — R1013 Epigastric pain: Secondary | ICD-10-CM | POA: Diagnosis not present

## 2022-07-18 DIAGNOSIS — G4720 Circadian rhythm sleep disorder, unspecified type: Secondary | ICD-10-CM | POA: Insufficient documentation

## 2022-07-18 DIAGNOSIS — E041 Nontoxic single thyroid nodule: Secondary | ICD-10-CM | POA: Insufficient documentation

## 2022-07-18 MED ORDER — PANTOPRAZOLE SODIUM 40 MG PO TBEC: 40.0000 mg | DELAYED_RELEASE_TABLET | Freq: Every day | ORAL | 3 refills | Status: DC

## 2022-07-18 NOTE — Progress Notes (Signed)
Veronica Chen 57 y.o.   Chief Complaint  Patient presents with   Abdominal Pain    Has been doing with it for a while , hurts worse after eating in upper middle quadrant sometimes gets nausea and the feeling is tight and feels full quickly, also have air bubbles in chest where she feels like she needs to burp but can't    HISTORY OF PRESENT ILLNESS: Acute problem visit today.  Patient of Dr. Billey Gosling. This is a 57 y.o. female complaining of upper abdominal burning sensation with occasional fullness and nausea on and off for the past several months Sometimes worse with eating Denies rectal bleeding or melena.  No fever or chills. No other associated symptoms. 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  Abdominal Pain Associated symptoms include nausea. Pertinent negatives include no constipation, diarrhea, dysuria, fever, headaches, hematuria, melena or vomiting.     Prior to Admission medications   Medication Sig Start Date End Date Taking? Authorizing Provider  albuterol (PROVENTIL HFA;VENTOLIN HFA) 108 (90 Base) MCG/ACT inhaler Inhale 2 puffs into the lungs every 6 (six) hours as needed for wheezing or shortness of breath. 05/24/18  Yes Marrian Salvage, FNP  Ascorbic Acid (VITAMIN C) 100 MG tablet Take 100 mg by mouth daily.   Yes [provider]  aspirin 81 MG tablet Take 81 mg by mouth daily.   Yes [provider]  b complex vitamins tablet Take 1 tablet by mouth daily.   Yes [provider]  Biotin 10 MG CHEW    Yes [provider]  clobetasol (OLUX) 0.05 % topical foam Apply 1 application topically daily as needed. 05/25/21  Yes [provider]  famotidine (PEPCID) 10 MG tablet Take by mouth.   Yes [provider]  FINACEA 15 % gel Apply 1 application topically daily. 05/25/21  Yes [provider]  Fluocinolone Acetonide 0.01 % OIL Instill 4 drops into both  ears every night for 14 days. Then stop and use as needed for itching. 01/21/20  Yes [provider]  fluticasone (FLONASE) 50 MCG/ACT nasal spray Place 2 sprays into both nostrils daily. 08/18/13  Yes Rowe Clack, MD  gabapentin (NEURONTIN) 100 MG capsule TAKE 1-3 CAPSULES BY MOUTH AT BEDTIME AS NEEDED. 12/01/21  Yes Burns, Claudina Lick, MD  imiquimod (ALDARA) 5 % cream SMARTSIG:1 Unit(s) Topical Daily 05/25/21  Yes [provider]  JUBLIA 10 % SOLN Apply topically daily. 05/25/21  Yes [provider]  MAGNESIUM PO Take by mouth.   Yes [provider]  methylPREDNISolone (MEDROL DOSEPAK) 4 MG TBPK tablet 24 mg PO on day 1, then decr. by 4 mg/day x5 days 11/02/21  Yes Henson, Vickie L, NP-C  metroNIDAZOLE (METROCREAM) 0.75 % cream SMARTSIG:1 Application Topical 1 to 2 Times Daily 11/13/19  Yes [provider]  VITAMIN D, CHOLECALCIFEROL, PO Take by mouth.   Yes [provider]  VITAMIN E PO Take by mouth.   Yes [provider]    No Known Allergies  Patient Active Problem List   Diagnosis Date Noted   Sleep pattern disturbance 07/18/2022   Thyroid nodule 07/18/2022   Acute recurrent maxillary sinusitis 12/27/2021   Reactive airway disease 05/30/2021   Dysphagia 05/30/2021   Hypertriglyceridemia 09/10/2020   Lumbar radiculopathy 07/23/2020   Seasonal allergies 04/24/2019   Sciatica of left side 12/27/2017   GERD (gastroesophageal reflux disease) 12/27/2017   Sensorineural hearing loss (  SNHL), bilateral 11/30/2016   Hearing loss of left ear 10/12/2016   Low back pain 12/14/2015   Vertigo 04/20/2015   History of chronic urinary tract infection 03/31/2014   Capsulitis of foot 02/04/2013   Sesamoiditis 02/04/2013   Allergic rhinitis    Osteopenia    THYROID NODULE 03/23/2010   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   ECZEMA 03/23/2010    Past Medical History:  Diagnosis Date   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 s defic, no hx DVT/VTE   ECZEMA    Palpitations    Protein S deficiency ([facility])    PVC's (premature ventricular contractions)    THYROID NODULE    Vertigo     Past Surgical History:  Procedure Laterality Date   CESAREAN SECTION     x2   uterine ablation      Social History   Socioeconomic History   Marital status: Married    Spouse name: Not on file   Number of children: 2   Years of education: college 2   Highest education level: Not on file  Occupational History   Occupation: Real Air traffic controller: Electrical engineer  Tobacco Use   Smoking status: Never   Smokeless tobacco: Never   Tobacco comments:    Married, lives with spouse and 2 kids-employed as Engineer, site  Substance and Sexual Activity   Alcohol use: No   Drug use: No   Sexual activity: Not on file  Other Topics Concern   Not on file  Social History Narrative   Patient lives at home with husband [name]    Patient has 2 children.    Patient has a college education.    Patient is right handed.    Social Determinants of Health   Financial Resource Strain: Not on file  Food Insecurity: Not on file  Transportation Needs: Not on file  Physical Activity: Not on file  Stress: Not on file  Social Connections: Not on file  Intimate Partner Violence: Not on file    Family History  Problem Relation Age of Onset   Other Mother        parathyroid   Clotting disorder Father    Heart attack Unknown      Review of Systems  Constitutional: Negative.  Negative for chills and fever.  HENT: Negative.    Respiratory: Negative.  Negative for cough and shortness of breath.   Cardiovascular: Negative.  Negative for chest pain and palpitations.  Gastrointestinal:  Positive for abdominal pain and nausea. Negative for blood in stool, constipation, diarrhea, melena and vomiting.  Genitourinary: Negative.  Negative for dysuria and hematuria.  Skin: Negative.  Negative for rash.  Neurological:  Negative.  Negative for dizziness and headaches.  All other systems reviewed and are negative.   Vitals:   07/18/22 1510  BP: 120/78  Pulse: 68  Temp: 98 F (36.7 C)  SpO2: 99%    Physical Exam Vitals reviewed.  Constitutional:      Appearance: She is well-developed.  HENT:     Head: Normocephalic.     Mouth/Throat:     Mouth: Mucous membranes are moist.     Pharynx: Oropharynx is clear.  Eyes:     Extraocular Movements: Extraocular movements intact.     Conjunctiva/sclera: Conjunctivae normal.     Pupils: Pupils are equal, round, and reactive to light.  Cardiovascular:     Rate and Rhythm: Normal rate and regular rhythm.  Pulses: Normal pulses.     Heart sounds: Normal heart sounds.  Pulmonary:     Effort: Pulmonary effort is normal.     Breath sounds: Normal breath sounds.  Abdominal:     General: Bowel sounds are normal. There is no distension.     Palpations: Abdomen is soft.     Tenderness: There is abdominal tenderness (Epigastric tenderness).  Musculoskeletal:     Cervical back: No tenderness.     Right lower leg: No edema.     Left lower leg: No edema.  Lymphadenopathy:     Cervical: No cervical adenopathy.  Skin:    General: Skin is warm and dry.     Capillary Refill: Capillary refill takes less than 2 seconds.  Neurological:     General: No focal deficit present.     Mental Status: She is alert and oriented to person, place, and time.  Psychiatric:        Mood and Affect: Mood normal.        Behavior: Behavior normal.      ASSESSMENT & PLAN: A total of 42 minutes was spent with the patient and counseling/coordination of care regarding preparing for this visit, review of most recent office visit notes, review of chronic medical conditions under management, review of all medications, comprehensive history and physical examination, differential diagnosis of dyspepsia and need for workup including GI evaluation and upper endoscopy, medication  management with PPIs and H2 blockers, education on nutrition, prognosis, documentation, and need for follow-up.  Problem List Items Addressed This Visit       Other   Dyspepsia - Primary    Clinically stable.  No red flag signs or symptoms Differential diagnosis discussed Most likely gastritis versus peptic ulcer disease triggered by stress Recommend to continue famotidine 20 mg at bedtime Start pantoprazole 40 mg daily Diet and nutrition discussed Avoid NSAIDs and stay well-hydrated Needs GI evaluation and possible upper endoscopy Referral placed today.      Relevant Medications   pantoprazole (PROTONIX) 40 MG tablet   Other Relevant Orders   Ambulatory referral to Gastroenterology   Patient Instructions  Gastritis, Adult Gastritis is irritation and swelling (inflammation) of the stomach. There are two kinds of gastritis: Acute gastritis. This kind develops quickly. Chronic gastritis. This kind is much more common. It develops slowly and lasts for a long time. It is important to get help for this condition. If you do not get help, your stomach can bleed, and you can get sores (ulcers) in your stomach. What are the causes? This condition may be caused by: Germs that get to your stomach and cause an infection. Drinking too much alcohol. Medicines you are taking. Having too much acid in the stomach. Having a disease of the stomach. Other causes may include: An allergic reaction. Some cancer treatments (radiation). Smoking cigarettes or using products that contain nicotine or tobacco. In some cases, the cause of this condition is not known. What increases the risk? Having a disease of the intestines. Having Crohn's disease. Using aspirin or ibuprofen and other NSAIDs to treat other conditions. Stress. What are the signs or symptoms? Pain in your stomach. A burning feeling in your stomach. Feeling like you may vomit (nauseous). Vomiting or vomiting blood. Feeling too  full after you eat. Weight loss. Bad breath. Blood in your poop (stool). In some cases, there are no symptoms. How is this treated? This condition is treated with medicines. The medicines that are used depend on what caused  the condition. You may be given: Antibiotic medicine, if your condition was caused by an infection from germs. H2 blockers and similar medicines, if your condition was caused by too much acid in the stomach. Treatment may also include stopping the use of certain medicines, such as aspirin or ibuprofen. Follow these instructions at home: Medicines Take over-the-counter and prescription medicines only as told by your doctor. If you were prescribed an antibiotic medicine, take it as told by your doctor. Do not stop taking it even if you start to feel better. Alcohol use Do not drink alcohol if: Your doctor tells you not to drink. You are pregnant, may be pregnant, or are planning to become pregnant. If you drink alcohol: Limit your use to: 0-1 drink a day for women. 0-2 drinks a day for men. Know how much alcohol is in your drink. In the [place] one drink equals one 12 oz bottle of beer (355 mL), one 5 oz glass of wine (148 mL), or one 1 oz glass of hard liquor (44 mL). General instructions  Eat small meals often, instead of large meals. Avoid foods and drinks that make you feel worse. Drink enough fluid to keep your pee (urine) pale yellow. Talk with your doctor about ways to manage stress. You can exercise or do deep breathing, meditation, or yoga. Do not smoke or use any products that contain nicotine or tobacco. If you need help quitting, ask your doctor. Keep all follow-up visits. Contact a doctor if: Your symptoms get worse. Your stomach pain gets worse. Your symptoms go away and then come back. You have a fever. Get help right away if: You vomit blood or something that looks like coffee grounds. You have black or dark red poop. You throw up any time you  try to drink fluids. These symptoms may be an emergency. Get help right away. Call your local emergency services (911 in the [place] Do not wait to see if the symptoms will go away. Do not drive yourself to the hospital. Summary Gastritis is irritation and swelling (inflammation) of the stomach. You must get help for this condition. If you do not get help, your stomach can bleed, and you can get sores (ulcers) in your stomach. You can be treated with medicines for germs or medicines to block too much acid in your stomach. This information is not intended to replace advice given to you by your health care provider. Make sure you discuss any questions you have with your health care provider. Document Revised: 08/14/2020 Document Reviewed: 08/14/2020 Elsevier Patient Education  [place], MD [facility]

## 2022-07-18 NOTE — Patient Instructions (Signed)
Gastritis, Adult Gastritis is irritation and swelling (inflammation) of the stomach. There are two kinds of gastritis: Acute gastritis. This kind develops quickly. Chronic gastritis. This kind is much more common. It develops slowly and lasts for a long time. It is important to get help for this condition. If you do not get help, your stomach can bleed, and you can get sores (ulcers) in your stomach. What are the causes? This condition may be caused by: Germs that get to your stomach and cause an infection. Drinking too much alcohol. Medicines you are taking. Having too much acid in the stomach. Having a disease of the stomach. Other causes may include: An allergic reaction. Some cancer treatments (radiation). Smoking cigarettes or using products that contain nicotine or tobacco. In some cases, the cause of this condition is not known. What increases the risk? Having a disease of the intestines. Having Crohn's disease. Using aspirin or ibuprofen and other NSAIDs to treat other conditions. Stress. What are the signs or symptoms? Pain in your stomach. A burning feeling in your stomach. Feeling like you may vomit (nauseous). Vomiting or vomiting blood. Feeling too full after you eat. Weight loss. Bad breath. Blood in your poop (stool). In some cases, there are no symptoms. How is this treated? This condition is treated with medicines. The medicines that are used depend on what caused the condition. You may be given: Antibiotic medicine, if your condition was caused by an infection from germs. H2 blockers and similar medicines, if your condition was caused by too much acid in the stomach. Treatment may also include stopping the use of certain medicines, such as aspirin or ibuprofen. Follow these instructions at home: Medicines Take over-the-counter and prescription medicines only as told by your doctor. If you were prescribed an antibiotic medicine, take it as told by your doctor.  Do not stop taking it even if you start to feel better. Alcohol use Do not drink alcohol if: Your doctor tells you not to drink. You are pregnant, may be pregnant, or are planning to become pregnant. If you drink alcohol: Limit your use to: 0-1 drink a day for women. 0-2 drinks a day for men. Know how much alcohol is in your drink. In the [place] one drink equals one 12 oz bottle of beer (355 mL), one 5 oz glass of wine (148 mL), or one 1 oz glass of hard liquor (44 mL). General instructions  Eat small meals often, instead of large meals. Avoid foods and drinks that make you feel worse. Drink enough fluid to keep your pee (urine) pale yellow. Talk with your doctor about ways to manage stress. You can exercise or do deep breathing, meditation, or yoga. Do not smoke or use any products that contain nicotine or tobacco. If you need help quitting, ask your doctor. Keep all follow-up visits. Contact a doctor if: Your symptoms get worse. Your stomach pain gets worse. Your symptoms go away and then come back. You have a fever. Get help right away if: You vomit blood or something that looks like coffee grounds. You have black or dark red poop. You throw up any time you try to drink fluids. These symptoms may be an emergency. Get help right away. Call your local emergency services (911 in the [place] Do not wait to see if the symptoms will go away. Do not drive yourself to the hospital. Summary Gastritis is irritation and swelling (inflammation) of the stomach. You must get help for this condition. If you do   not get help, your stomach can bleed, and you can get sores (ulcers) in your stomach. You can be treated with medicines for germs or medicines to block too much acid in your stomach. This information is not intended to replace advice given to you by your health care provider. Make sure you discuss any questions you have with your health care provider. Document Revised: 08/14/2020 Document  Reviewed: 08/14/2020 Elsevier Patient Education  2023 Elsevier Inc.  

## 2022-07-18 NOTE — Assessment & Plan Note (Signed)
Clinically stable.  No red flag signs or symptoms Differential diagnosis discussed Most likely gastritis versus peptic ulcer disease triggered by stress Recommend to continue famotidine 20 mg at bedtime Start pantoprazole 40 mg daily Diet and nutrition discussed Avoid NSAIDs and stay well-hydrated Needs GI evaluation and possible upper endoscopy Referral placed today.

## 2022-08-09 ENCOUNTER — Ambulatory Visit
Admission: RE | Admit: 2022-08-09 | Discharge: 2022-08-09 | Disposition: A | Discharge status: Home / Self Care | Payer: 59 | Source: Ambulatory Visit | Attending: Obstetrics and Gynecology | Admitting: Obstetrics and Gynecology

## 2022-08-09 DIAGNOSIS — R911 Solitary pulmonary nodule: Secondary | ICD-10-CM

## 2022-08-17 ENCOUNTER — Telehealth: Payer: 59 | Admitting: Physician Assistant

## 2022-08-17 DIAGNOSIS — J208 Acute bronchitis due to other specified organisms: Secondary | ICD-10-CM | POA: Diagnosis not present

## 2022-08-17 MED ORDER — AMOXICILLIN-POT CLAVULANATE 875-125 MG PO TABS: 1.0000 | ORAL_TABLET | Freq: Two times a day (BID) | ORAL | 0 refills | Status: DC

## 2022-08-17 MED ORDER — FLUTICASONE PROPIONATE 50 MCG/ACT NA SUSP: 2.0000 | Freq: Every day | NASAL | 0 refills | Status: DC

## 2022-08-17 MED ORDER — ALBUTEROL SULFATE HFA 108 (90 BASE) MCG/ACT IN AERS: 2.0000 | INHALATION_SPRAY | Freq: Four times a day (QID) | RESPIRATORY_TRACT | 0 refills | Status: DC | PRN

## 2022-08-17 MED ORDER — BENZONATATE 100 MG PO CAPS: 100.0000 mg | ORAL_CAPSULE | Freq: Three times a day (TID) | ORAL | 0 refills | Status: DC | PRN

## 2022-08-17 NOTE — Progress Notes (Signed)
I have spent 5 minutes in review of e-visit questionnaire, review and updating patient chart, medical decision making and response to patient.   Sonita Michiels Cody Tiwanda Threats, PA-C    

## 2022-08-17 NOTE — Progress Notes (Signed)
E-Visit for Cough  We are sorry that you are not feeling well.  Here is how we plan to help!  Based on your presentation I believe you most likely have A cough due to a virus.  This is called viral bronchitis and is best treated by rest, plenty of fluids and control of the cough.  You may use Ibuprofen or Tylenol as directed to help your symptoms.     In addition you may use A prescription cough medication called Tessalon Perles . You may take 1-2 capsules every 8 hours as needed for your cough.  I have placed an antibiotic on file for you to start only if symptoms are not beginning to improve/resolve on their own over the next 72 hours or any continual or worsening sinus pain.   From your responses in the eVisit questionnaire you describe inflammation in the upper respiratory tract which is causing a significant cough.  This is commonly called Bronchitis and has four common causes:   Allergies Viral Infections Acid Reflux Bacterial Infection Allergies, viruses and acid reflux are treated by controlling symptoms or eliminating the cause. An example might be a cough caused by taking certain blood pressure medications. You stop the cough by changing the medication. Another example might be a cough caused by acid reflux. Controlling the reflux helps control the cough.  USE OF BRONCHODILATOR ("RESCUE") INHALERS: There is a risk from using your bronchodilator too frequently.  The risk is that over-reliance on a medication which only relaxes the muscles surrounding the breathing tubes can reduce the effectiveness of medications prescribed to reduce swelling and congestion of the tubes themselves.  Although you feel brief relief from the bronchodilator inhaler, your asthma may actually be worsening with the tubes becoming more swollen and filled with mucus.  This can delay other crucial treatments, such as oral steroid medications. If you need to use a bronchodilator inhaler daily, several times per  day, you should discuss this with your provider.  There are probably better treatments that could be used to keep your asthma under control.     HOME CARE Only take medications as instructed by your medical team. Complete the entire course of an antibiotic. Drink plenty of fluids and get plenty of rest. Avoid close contacts especially the very young and the elderly Cover your mouth if you cough or cough into your sleeve. Always remember to wash your hands A steam or ultrasonic humidifier can help congestion.   GET HELP RIGHT AWAY IF: You develop worsening fever. You become short of breath You cough up blood. Your symptoms persist after you have completed your treatment plan MAKE SURE YOU  Understand these instructions. Will watch your condition. Will get help right away if you are not doing well or get worse.    Thank you for choosing an e-visit.  Your e-visit answers were reviewed by a board certified advanced clinical practitioner to complete your personal care plan. Depending upon the condition, your plan could have included both over the counter or prescription medications.  Please review your pharmacy choice. Make sure the pharmacy is open so you can pick up prescription now. If there is a problem, you may contact your provider through [employer] and have the prescription routed to another pharmacy.  Your safety is important to [place]. If you have drug allergies check your prescription carefully.   For the next 24 hours you can use MyChart to ask questions about today's visit, request a non-urgent call back, or  ask for a work or school excuse. You will get an email in the next two days asking about your experience. I hope that your e-visit has been valuable and will speed your recovery.

## 2022-08-17 NOTE — Addendum Note (Signed)
Addended by: Waldon Merl on: 08/17/2022 08:02 AM   Modules accepted: Orders

## 2022-08-23 ENCOUNTER — Encounter: Payer: Self-pay | Admitting: Gastroenterology

## 2022-11-10 ENCOUNTER — Encounter: Payer: Self-pay | Admitting: Gastroenterology

## 2022-11-10 ENCOUNTER — Ambulatory Visit (INDEPENDENT_AMBULATORY_CARE_PROVIDER_SITE_OTHER): Payer: 59 | Admitting: Gastroenterology

## 2022-11-10 ENCOUNTER — Other Ambulatory Visit: Payer: Self-pay

## 2022-11-10 VITALS — BP 118/70 | HR 79 | Ht 60.0 in | Wt 136.0 lb

## 2022-11-10 DIAGNOSIS — R1013 Epigastric pain: Secondary | ICD-10-CM

## 2022-11-10 MED ORDER — PANTOPRAZOLE SODIUM 40 MG PO TBEC: 40.0000 mg | DELAYED_RELEASE_TABLET | Freq: Every day | ORAL | 3 refills | Status: AC

## 2022-11-10 NOTE — Patient Instructions (Signed)
_______________________________________________________  If your blood pressure at your visit was 140/90 or greater, please contact your primary care physician to follow up on this.  _______________________________________________________  If you are age 56 or older, your body mass index should be between 23-30. Your Body mass index is 26.56 kg/m. If this is out of the aforementioned range listed, please consider follow up with your Primary Care Provider.  If you are age 54 or younger, your body mass index should be between 19-25. Your Body mass index is 26.56 kg/m. If this is out of the aformentioned range listed, please consider follow up with your Primary Care Provider.   ________________________________________________________  The [facility] providers would like to encourage you to use [facility] to communicate with providers for non-urgent requests or questions.  Due to long hold times on the telephone, sending your provider a message by [facility] may be a faster and more efficient way to get a response.  Please allow 48 business hours for a response.  Please remember that this is for non-urgent requests.  _______________________________________________________  Your provider has requested that you go to the basement level for lab work before leaving today. Press "B" on the elevator. The lab is located at the first door on the left as you exit the elevator.  You have been scheduled for an abdominal ultrasound at [facility] (1st floor of hospital) on 11-21-2022 at 9am. Please arrive 30 minutes prior to your appointment for registration. Make certain not to have anything to eat or drink 6 hours prior to your appointment. Should you need to reschedule your appointment, please contact radiology at [phone]. This test typically takes about 30 minutes to perform.   You have been scheduled for an endoscopy. Please follow written instructions given to you at your visit today.  If you  use inhalers (even only as needed), please bring them with you on the day of your procedure.  If you take any of the following medications, they will need to be adjusted prior to your procedure:   DO NOT TAKE 7 DAYS PRIOR TO TEST- Trulicity (dulaglutide) Ozempic, Wegovy (semaglutide) Mounjaro (tirzepatide) Bydureon Bcise (exanatide extended release)  DO NOT TAKE 1 DAY PRIOR TO YOUR TEST Rybelsus (semaglutide) Adlyxin (lixisenatide) Victoza (liraglutide) Byetta (exanatide)   Due to recent changes in healthcare laws, you may see the results of your imaging and laboratory studies on MyChart before your provider has had a chance to review them.  We understand that in some cases there may be results that are confusing or concerning to you. Not all laboratory results come back in the same time frame and the provider may be waiting for multiple results in order to interpret others.  Please give [place] 48 hours in order for your provider to thoroughly review all the results before contacting the office for clarification of your results.   It was a pleasure to see you today!  Thank you for trusting me with your gastrointestinal care!

## 2022-11-10 NOTE — Progress Notes (Signed)
[facility] Consult Note:  History: Veronica Chen 11/10/2022  Referring provider: Pincus Sanes, MD  Reason for consult/chief complaint: Abdominal Pain (Pt was having epigastric pain four months ago, pt taking pantoprazole and states she is doing better)   Subjective  HPI: Referrerd by PCP for several months of dyspeptic symptoms with upper abdominal burning pain, nausea.  PPI added to her H2RA. No further testing done, last labs in Feb 2020  Reports she was having frequent crampy and tight mid epigastric pain for months, also early satiety.  Much improved with PPI, but had an episode after taking an Aleve (which she infrequently does).  No vomiting or dysphagia.  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 For many years has felt sensitive to any pressure on lower chest wall, upper abdomen, like with a tight bra or shirt.  Denies lower GI symptoms, prior negative cologuards.  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  HENT:  Negative for mouth sores and voice change.   Eyes:  Negative for pain and redness.  Respiratory:  Negative for cough and shortness of breath.   Cardiovascular:  Negative for chest pain and palpitations.  Genitourinary:  Negative for dysuria and hematuria.  Musculoskeletal:  Negative for arthralgias and myalgias.  Skin:  Positive for rash. Negative for pallor.       Intermittent eczyma  Neurological:  Negative for weakness and headaches.  Hematological:  Negative for adenopathy.     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   ECZEMA    Palpitations    Protein S deficiency (HCC)    PVC's (premature ventricular contractions)    THYROID NODULE    Vertigo      Past Surgical History: Past  Surgical History:  Procedure Laterality Date   CESAREAN SECTION     x2   uterine ablation       Family History: Family History  Problem Relation Age of Onset   Other Mother        parathyroid   Clotting disorder Father    Heart attack Other    Stomach cancer Neg Hx    Esophageal cancer Neg Hx    Colon cancer Neg Hx     Social History: Social History   Socioeconomic History   Marital status: Married    Spouse name: Not on file   Number of children: 2   Years of education: college 2   Highest education level: Not on file  Occupational History   Occupation: Real Environmental health practitioner: Therapist, music   Occupation: Programmer, multimedia  Tobacco Use   Smoking status: Never   Smokeless tobacco: Never   Tobacco comments:    Married, lives with spouse and 2 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    Patient has a college education.    Patient is right handed.    Social Determinants of Health  Financial Resource Strain: Not on file  Food Insecurity: Not on file  Transportation Needs: Not on file  Physical Activity: Not on file  Stress: Not on file  Social Connections: Not on file    Allergies: No Known Allergies  Outpatient Meds: Current Outpatient Medications  Medication Sig Dispense Refill   amoxicillin-clavulanate (AUGMENTIN) 875-125 MG tablet Take 1 tablet by mouth 2 (two) times daily. 14 tablet 0   aspirin 81 MG tablet Take 81 mg by mouth daily.     b complex vitamins tablet Take 1 tablet by mouth daily.     benzonatate (TESSALON) 100 MG capsule Take 1 capsule (100 mg total) by mouth 3 (three) times daily as needed for cough. 30 capsule 0   Biotin 10 MG CHEW      FINACEA 15 % gel Apply 1  application topically daily.     Fluocinolone Acetonide 0.01 % OIL Instill 4 drops into both ears every night for 14 days. Then stop and use as needed for itching.     fluticasone (FLONASE) 50 MCG/ACT nasal spray Place 2 sprays into both nostrils daily. 16 g 0   gabapentin (NEURONTIN) 100 MG capsule TAKE 1-3 CAPSULES BY MOUTH AT BEDTIME AS NEEDED. 90 capsule 5   JUBLIA 10 % SOLN Apply topically daily.     MAGNESIUM PO Take by mouth.     metroNIDAZOLE (METROCREAM) 0.75 % cream SMARTSIG:1 Application Topical 1 to 2 Times Daily     albuterol (VENTOLIN HFA) 108 (90 Base) MCG/ACT inhaler Inhale 2 puffs into the lungs every 6 (six) hours as needed for wheezing or shortness of breath. (Patient not taking: Reported on 11/10/2022) 8 g 0   Ascorbic Acid (VITAMIN C) 100 MG tablet Take 100 mg by mouth daily. (Patient not taking: Reported on 11/10/2022)     clobetasol (OLUX) 0.05 % topical foam Apply 1 application topically daily as needed. (Patient not taking: Reported on 11/10/2022)     famotidine (PEPCID) 10 MG tablet Take by mouth. (Patient not taking: Reported on 11/10/2022)     imiquimod (ALDARA) 5 % cream SMARTSIG:1 Unit(s) Topical Daily (Patient not taking: Reported on 11/10/2022)     methylPREDNISolone (MEDROL DOSEPAK) 4 MG TBPK tablet 24 mg PO on day 1, then decr. by 4 mg/day x5 days (Patient not taking: Reported on 11/10/2022) 21 tablet 0   pantoprazole (PROTONIX) 40 MG tablet Take 1 tablet (40 mg total) by mouth daily. 30 tablet 3   VITAMIN D, CHOLECALCIFEROL, PO Take by mouth. (Patient not taking: Reported on 11/10/2022)     VITAMIN E PO Take by mouth. (Patient not taking: Reported on 11/10/2022)     No current facility-administered medications for this visit.      ___________________________________________________________________ Objective   Exam:  BP 118/70   Pulse 79   Ht 5' (1.524 m)   Wt 136 lb (61.7 kg)   BMI 26.56 kg/m  Wt Readings from Last 3 Encounters:  11/10/22 136 lb (61.7 kg)   07/18/22 137 lb (62.1 kg)  11/02/21 129 lb 6 oz (58.7 kg)    General: well-appearing and normal vocal quality  Eyes: sclera anicteric, no redness ENT: oral mucosa moist without lesions, no cervical or supraclavicular lymphadenopathy CV: reg without murmur, no JVD, no peripheral edema Resp: clear to auscultation bilaterally, normal RR and effort noted GI: soft, + epigastric tenderness, with active bowel sounds. No guarding or palpable organomegaly noted. Skin; warm and dry, no rash or jaundice noted Neuro: awake, alert and oriented  x 3. Normal gross motor function and fluent speech  Labs:  Says regular labs with Gyn ([facility]), but I am unable to find them in this EMR Neg cologuard Jan 2023, May 2019  No abdominal imaging for years  Assessment: Encounter Diagnosis  Name Primary?   Epigastric pain Yes   ? H pylori , PUD (on ASA), Biliary colic  Plan:  EGD.  Agreeable after discussion BARS  The benefits and risks of the planned procedure were described in detail with the patient or (when appropriate) their health care proxy.  Risks were outlined as including, but not limited to, bleeding, infection, perforation, adverse medication reaction leading to cardiac or pulmonary decompensation, pancreatitis (if ERCP).  The limitation of incomplete mucosal visualization was also discussed.  No guarantees or warranties were given.  RUQ U/S  CBC and CMP today  Thank you for the courtesy of this consult.  Please call me with any questions or concerns.  Charlie Pitter III  CC: Referring provider noted above

## 2022-11-17 ENCOUNTER — Other Ambulatory Visit (INDEPENDENT_AMBULATORY_CARE_PROVIDER_SITE_OTHER): Payer: 59

## 2022-11-17 DIAGNOSIS — R1013 Epigastric pain: Secondary | ICD-10-CM | POA: Diagnosis not present

## 2022-11-17 LAB — COMPREHENSIVE METABOLIC PANEL
ALT: 14 U/L (ref 0–35)
AST: 19 U/L (ref 0–37)
Albumin: 4.2 g/dL (ref 3.5–5.2)
Alkaline Phosphatase: 59 U/L (ref 39–117)
BUN: 22 mg/dL (ref 6–23)
CO2: 30 mEq/L (ref 19–32)
Calcium: 9.6 mg/dL (ref 8.4–10.5)
Chloride: 101 mEq/L (ref 96–112)
Creatinine, Ser: 0.9 mg/dL (ref 0.40–1.20)
GFR: 71.67 mL/min (ref >60.00)
Glucose, Bld: 82 mg/dL (ref 70–99)
Potassium: 4.3 mEq/L (ref 3.5–5.1)
Sodium: 139 mEq/L (ref 135–145)
Total Bilirubin: 0.5 mg/dL (ref 0.2–1.2)
Total Protein: 6.9 g/dL (ref 6.0–8.3)

## 2022-11-17 LAB — CBC WITH DIFFERENTIAL/PLATELET
Basophils Absolute: 0 10*3/uL (ref 0.0–0.1)
Basophils Relative: 0.6 % (ref 0.0–3.0)
Eosinophils Absolute: 0.2 10*3/uL (ref 0.0–0.7)
Eosinophils Relative: 3 % (ref 0.0–5.0)
HCT: 41.1 % (ref 36.0–46.0)
Hemoglobin: 13.5 g/dL (ref 12.0–15.0)
Lymphocytes Relative: 28.5 % (ref 12.0–46.0)
Lymphs Abs: 1.8 10*3/uL (ref 0.7–4.0)
MCHC: 32.9 g/dL (ref 30.0–36.0)
MCV: 91.2 fl (ref 78.0–100.0)
Monocytes Absolute: 0.6 10*3/uL (ref 0.1–1.0)
Monocytes Relative: 10 % (ref 3.0–12.0)
Neutro Abs: 3.6 10*3/uL (ref 1.4–7.7)
Neutrophils Relative %: 57.9 % (ref 43.0–77.0)
Platelets: 306 10*3/uL (ref 150.0–400.0)
RBC: 4.5 Mil/uL (ref 3.87–5.11)
RDW: 12.4 % (ref 11.5–15.5)
WBC: 6.2 10*3/uL (ref 4.0–10.5)

## 2022-11-22 ENCOUNTER — Ambulatory Visit (HOSPITAL_COMMUNITY): Payer: 59

## 2022-11-27 ENCOUNTER — Ambulatory Visit (HOSPITAL_COMMUNITY): Payer: 59

## 2022-11-27 ENCOUNTER — Encounter: Payer: Self-pay | Admitting: Gastroenterology

## 2022-12-05 ENCOUNTER — Encounter: Payer: 59 | Admitting: Gastroenterology

## 2023-02-23 ENCOUNTER — Ambulatory Visit: Payer: 59 | Admitting: Internal Medicine

## 2023-02-23 VITALS — BP 106/74 | HR 70 | Temp 98.2°F | Ht 60.0 in | Wt 136.0 lb

## 2023-02-23 DIAGNOSIS — G44049 Chronic paroxysmal hemicrania, not intractable: Secondary | ICD-10-CM | POA: Diagnosis not present

## 2023-02-23 DIAGNOSIS — M792 Neuralgia and neuritis, unspecified: Secondary | ICD-10-CM

## 2023-02-23 MED ORDER — GABAPENTIN 100 MG PO CAPS: 100.0000 mg | ORAL_CAPSULE | Freq: Every evening | ORAL | 5 refills | Status: DC | PRN

## 2023-02-23 NOTE — Progress Notes (Unsigned)
Subjective:    Patient ID: Veronica Chen, female    DOB: 1965/11/20, 57 y.o.   MRN: [id number]      HPI Veronica Chen is here for  Chief Complaint  Patient presents with   Sinusitis    Right sided face pain and pressure (was told before she may have TMJ but has had sinus infection before as well). Hurts from head down into her arm   Last year - hit by lateer tag   Occ horrible pain right side of face down to shoudler and down arm - pain - throbbing, tingling.  Ear hurts - even inside.  Takes ibuprofen - helps a little - has to take if for a week sometimes.  Last year had swelling in right side of face and had a sinus infection.  Abx no help.  Saw dentist - saw sinus was swollen - absx - not help.   Ent - said it was an infection - gave stronger abx and it worked.    Had gabapnetin from back issues -- that is the only thing that relieves that pain.  Ibuproen does not help.    sometimes neck pain.      Medications and allergies reviewed with patient and updated if appropriate.  Current Outpatient Medications on File Prior to Visit  Medication Sig Dispense Refill   aspirin 81 MG tablet Take 81 mg by mouth daily.     b complex vitamins tablet Take 1 tablet by mouth daily.     pantoprazole (PROTONIX) 40 MG tablet Take 1 tablet (40 mg total) by mouth daily. 30 tablet 3   albuterol (VENTOLIN HFA) 108 (90 Base) MCG/ACT inhaler Inhale 2 puffs into the lungs every 6 (six) hours as needed for wheezing or shortness of breath. (Patient not taking: Reported on 11/10/2022) 8 g 0   amoxicillin-clavulanate (AUGMENTIN) 875-125 MG tablet Take 1 tablet by mouth 2 (two) times daily. (Patient not taking: Reported on 02/23/2023) 14 tablet 0   Ascorbic Acid (VITAMIN C) 100 MG tablet Take 100 mg by mouth daily. (Patient not taking: Reported on 11/10/2022)     Biotin 10 MG CHEW  (Patient not taking: Reported on 02/23/2023)     clobetasol (OLUX) 0.05 % topical foam Apply 1 application topically daily as needed.  (Patient not taking: Reported on 11/10/2022)     famotidine (PEPCID) 10 MG tablet Take by mouth. (Patient not taking: Reported on 11/10/2022)     FINACEA 15 % gel Apply 1 application topically daily.     Fluocinolone Acetonide 0.01 % OIL Instill 4 drops into both ears every night for 14 days. Then stop and use as needed for itching.     fluticasone (FLONASE) 50 MCG/ACT nasal spray Place 2 sprays into both nostrils daily. 16 g 0   gabapentin (NEURONTIN) 100 MG capsule TAKE 1-3 CAPSULES BY MOUTH AT BEDTIME AS NEEDED. 90 capsule 5   imiquimod (ALDARA) 5 % cream SMARTSIG:1 Unit(s) Topical Daily (Patient not taking: Reported on 11/10/2022)     JUBLIA 10 % SOLN Apply topically daily.     MAGNESIUM PO Take by mouth.     methylPREDNISolone (MEDROL DOSEPAK) 4 MG TBPK tablet 24 mg PO on day 1, then decr. by 4 mg/day x5 days (Patient not taking: Reported on 11/10/2022) 21 tablet 0   metroNIDAZOLE (METROCREAM) 0.75 % cream SMARTSIG:1 Application Topical 1 to 2 Times Daily     VITAMIN D, CHOLECALCIFEROL, PO Take by mouth.     VITAMIN E  PO Take by mouth. (Patient not taking: Reported on 11/10/2022)     No current facility-administered medications on file prior to visit.    Review of Systems     Objective:   Vitals:   02/23/23 1459  BP: 106/74  Pulse: 70  Temp: 98.2 F (36.8 C)  SpO2: 97%   BP Readings from Last 3 Encounters:  02/23/23 106/74  11/10/22 118/70  07/18/22 120/78   Wt Readings from Last 3 Encounters:  02/23/23 136 lb (61.7 kg)  11/10/22 136 lb (61.7 kg)  07/18/22 137 lb (62.1 kg)   Body mass index is 26.56 kg/m.    Physical Exam         Assessment & Plan:    See Problem List for Assessment and Plan of chronic medical problems.

## 2023-02-23 NOTE — Patient Instructions (Addendum)
        Medications changes include :   gabapentin 100-300 mg at bedtime as needed    A referral was ordered neurology and someone will call you to schedule an appointment.     Return if symptoms worsen or fail to improve.

## 2023-02-24 ENCOUNTER — Encounter: Payer: Self-pay | Admitting: Internal Medicine

## 2023-02-24 DIAGNOSIS — G44049 Chronic paroxysmal hemicrania, not intractable: Secondary | ICD-10-CM | POA: Insufficient documentation

## 2023-02-24 DIAGNOSIS — G500 Trigeminal neuralgia: Secondary | ICD-10-CM | POA: Insufficient documentation

## 2023-02-24 DIAGNOSIS — M792 Neuralgia and neuritis, unspecified: Secondary | ICD-10-CM | POA: Insufficient documentation

## 2023-02-24 NOTE — Assessment & Plan Note (Signed)
Having intermittent pain on right side of face  - entire side of face, extending to right shoulder and down right arm.  Occasionally has pain extending to posterior head Does not sound like TMJ - likely nerve pain Start gabapentin 100-300 mg at night prn  Referral to neuro Will hold off on imaging for now - not sure if MRI of brain will be sufficient

## 2023-03-15 ENCOUNTER — Ambulatory Visit
Admission: RE | Admit: 2023-03-15 | Discharge: 2023-03-15 | Disposition: A | Discharge status: Home / Self Care | Payer: 59 | Source: Ambulatory Visit | Attending: Internal Medicine | Admitting: Internal Medicine

## 2023-03-15 DIAGNOSIS — G44049 Chronic paroxysmal hemicrania, not intractable: Secondary | ICD-10-CM

## 2023-03-15 DIAGNOSIS — M792 Neuralgia and neuritis, unspecified: Secondary | ICD-10-CM

## 2023-04-03 ENCOUNTER — Encounter: Payer: Self-pay | Admitting: Internal Medicine

## 2023-04-11 ENCOUNTER — Ambulatory Visit (INDEPENDENT_AMBULATORY_CARE_PROVIDER_SITE_OTHER): Payer: 59

## 2023-04-11 DIAGNOSIS — Z23 Encounter for immunization: Secondary | ICD-10-CM

## 2023-04-11 NOTE — Progress Notes (Signed)
Per orders of Cheryll Cockayne, MD, injection of  Flulaval  given by Dorris Fetch in left deltoid. Patient tolerated injection well.

## 2023-04-18 IMAGING — MR MR FOOT*L* W/O CM
4 of 5 series · 19 of 40 positions shown · non-contrast
Comparison: None.

CLINICAL DATA: Left foot pain around second toe.

EXAM:
MRI OF THE LEFT FOOT WITHOUT CONTRAST
TECHNIQUE: Multiplanar, multisequence MR imaging of the left forefoot was
performed. No intravenous contrast was administered.

[Series 4: T1 · coronal · 3.0mm · 0.19mm/px · 3 of 44 slices shown (1 of 2)]
[im 5/44]
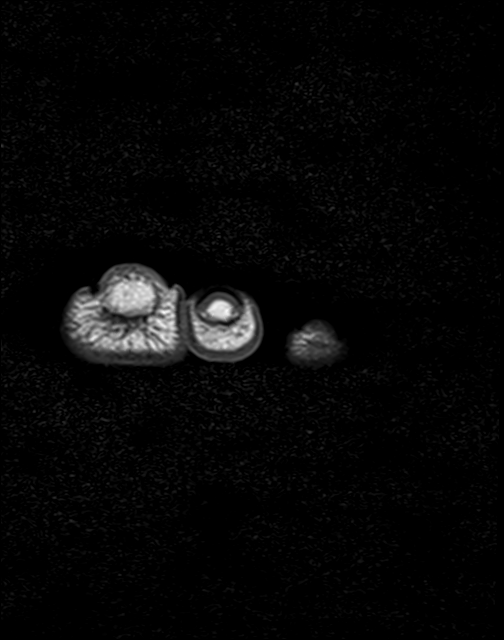
[im 24/44]
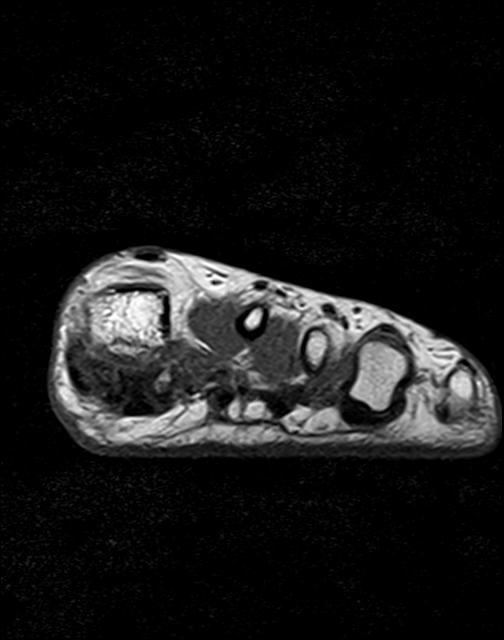
[im 39/44]
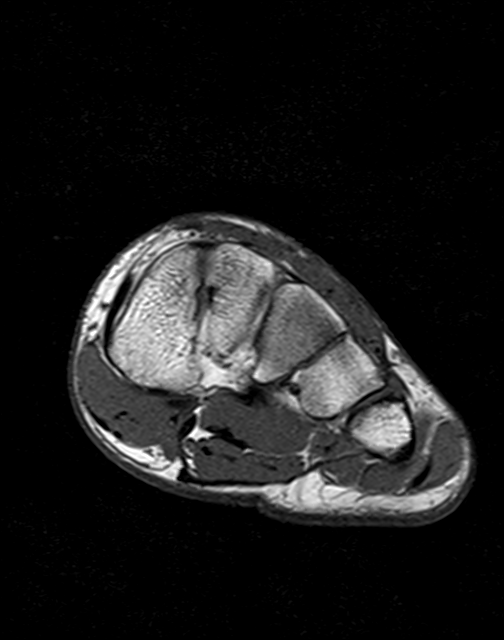

[Series 5: T2 fat-sat · coronal · 3.0mm · 0.19mm/px · 10 of 44 slices shown (1 of 2)]
[im 1/44]
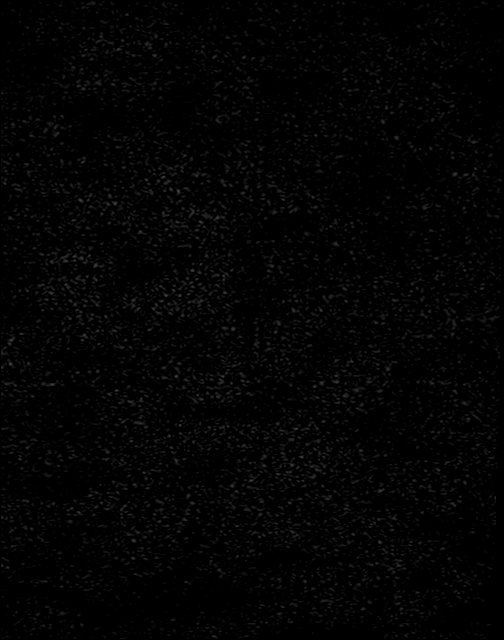
[im 5/44]
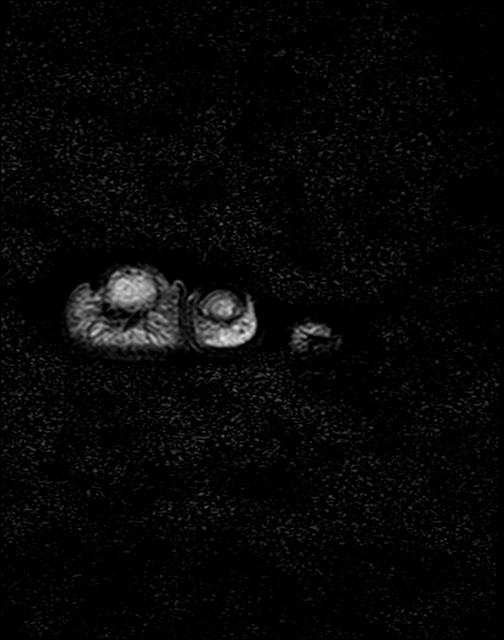
[im 9/44]
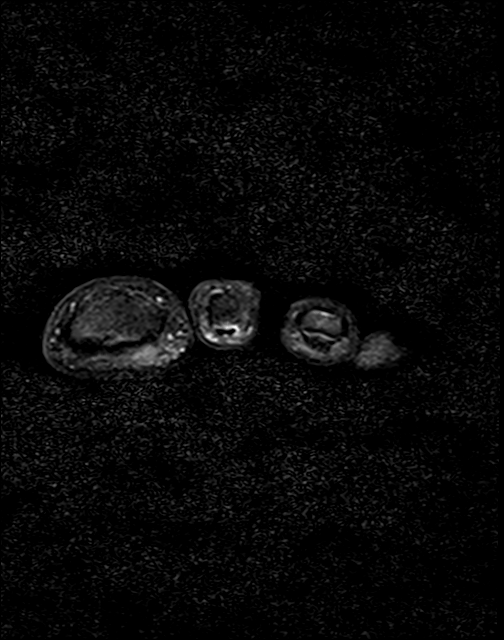
[im 13/44]
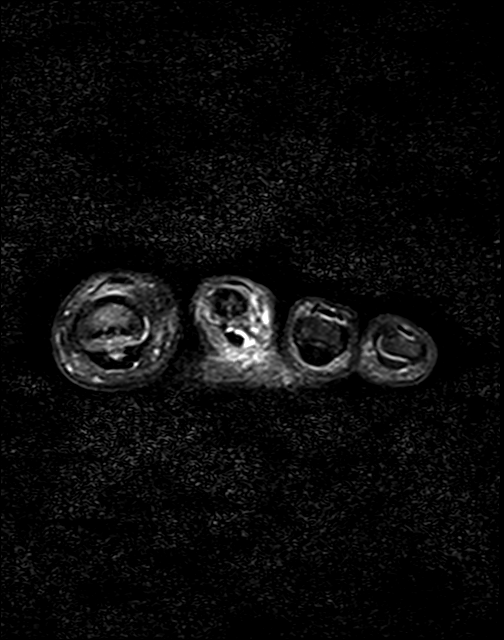
[im 18/44]
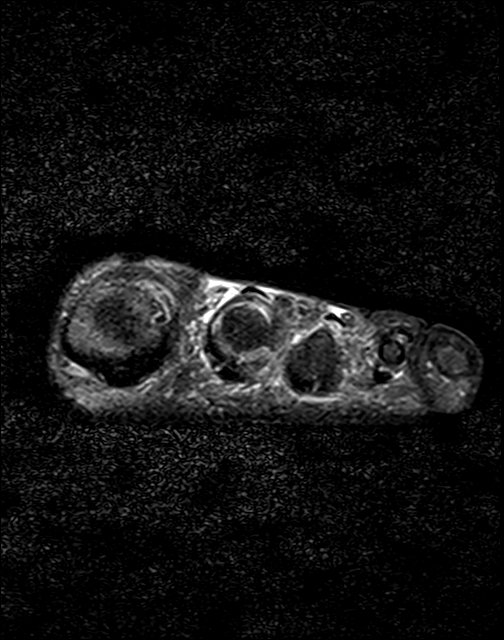
[im 22/44]
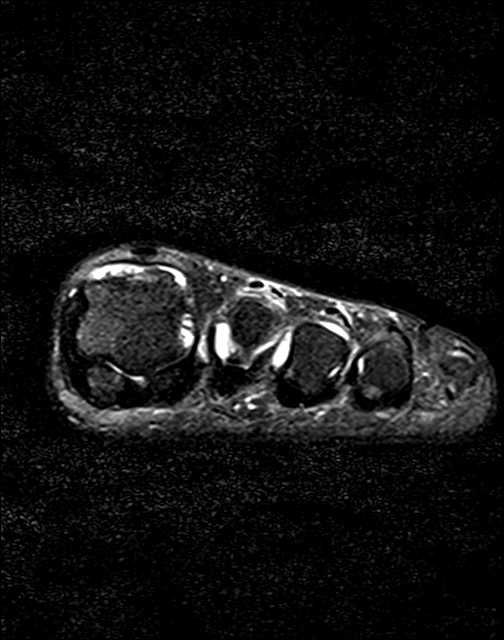
[im 26/44]
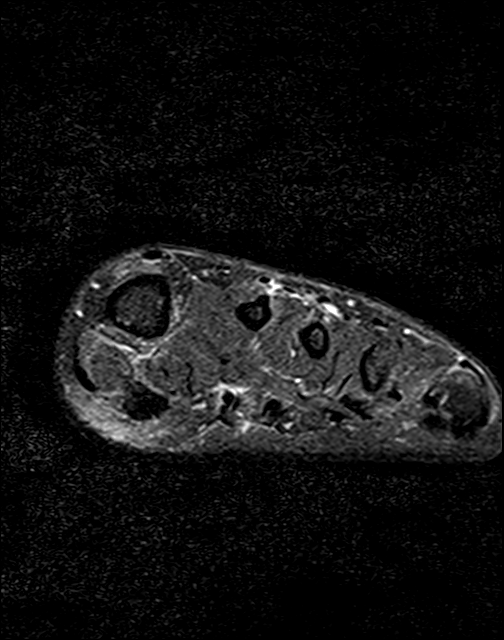
[im 31/44]
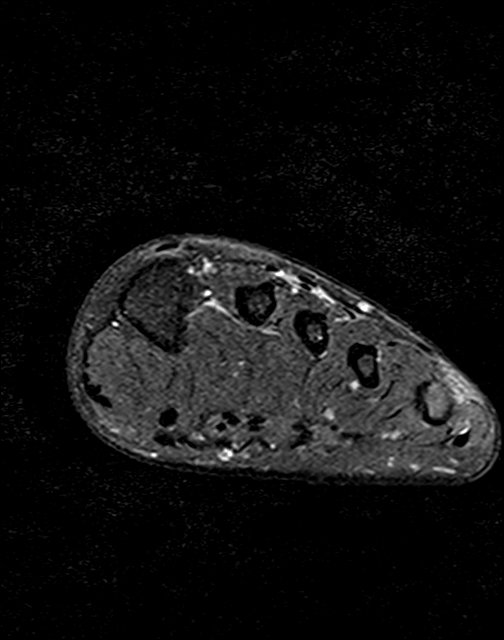
[im 35/44]
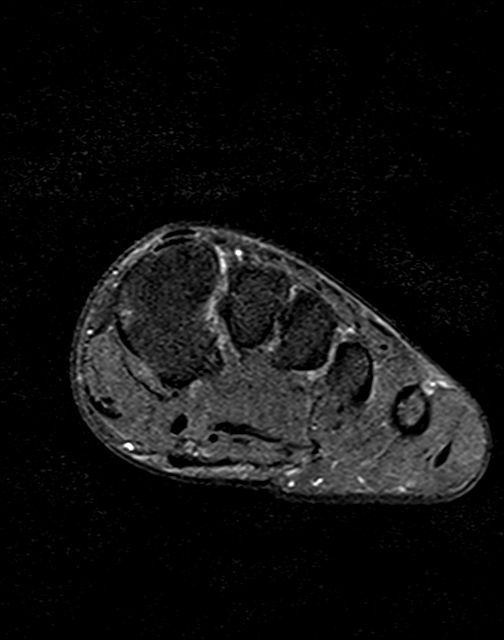
[im 39/44]
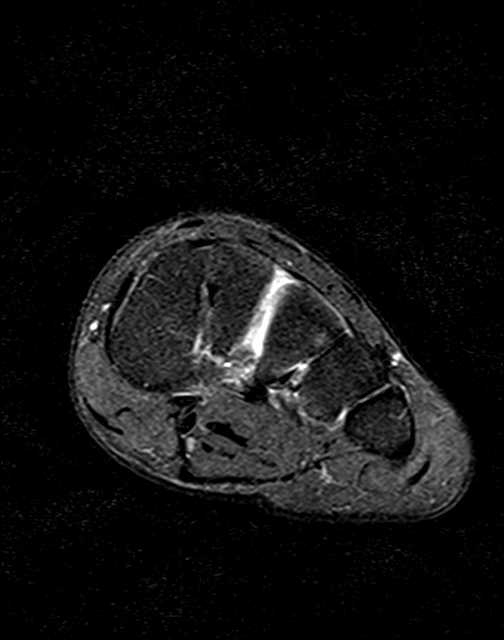

[Series 6: T2 fat-sat · axial · 3.0mm · 0.35mm/px · z∈[-29,+35]mm · 3 of 22 slices shown (2 of 2)]
[im 5/22]
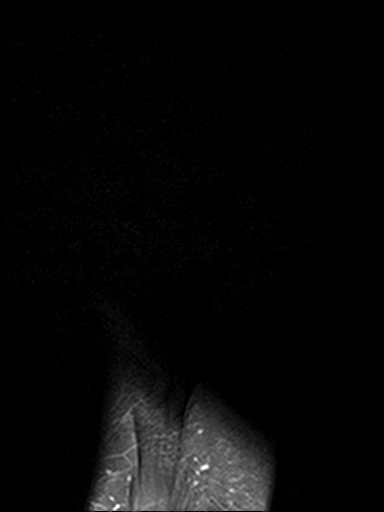
[im 13/22]
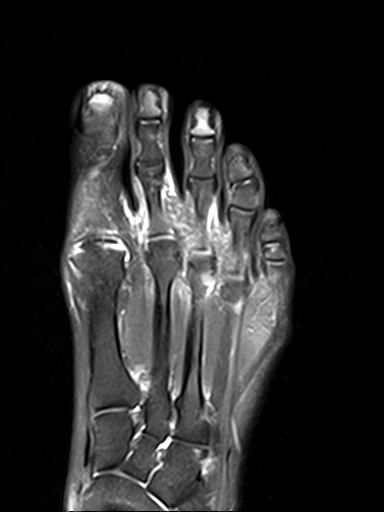
[im 22/22]
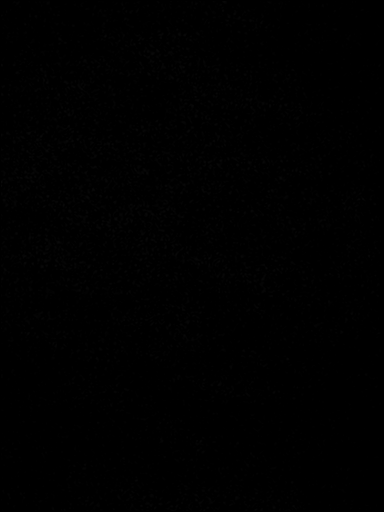

[Series 7: T1 · axial · 3.0mm · 0.35mm/px · z∈[-29,+35]mm · 3 of 22 slices shown (2 of 2)]
[im 5/22]
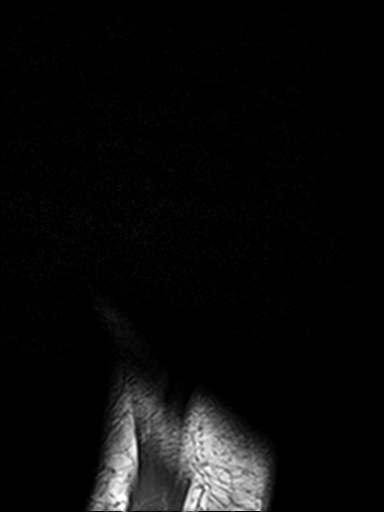
[im 13/22]
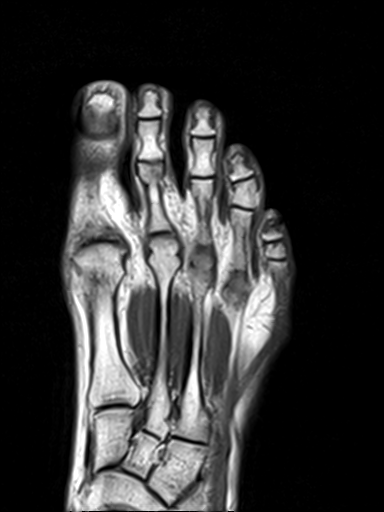
[im 22/22]
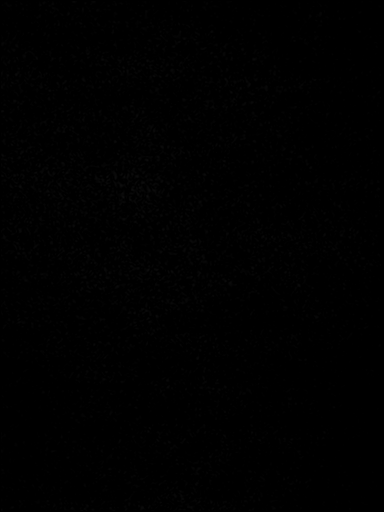

[19 of 40 positions shown; findings below may reference images not displayed]

FINDINGS: Bones/Joint/Cartilage

No fracture or dislocation. Normal alignment. No joint effusion. No
marrow signal abnormality.

Ligaments

There is edema at the expected location of the lateral collateral
ligament at the second meta tarsal phalangeal joint. The plantar
plates are intact. Lisfranc ligament is intact.

Muscles and Tendons

Flexor, peroneal and extensor compartment tendons are intact.
Muscles are normal. There is fluid about the insertion of first and
second lumbrical muscles, which may represent bursitis.

Soft tissue
There is mild edema about the second digit. No soft tissue mass. No
evidence of Morton's neuroma.
IMPRESSION: 1. Findings suspicious for tear of the lateral collateral ligament
of second digit.

2.  Plantar plates are intact.

3.  No evidence of Morton's neuroma.

4. Fluid signal about the insertion of first and second lumbricals
concerning for bursitis.

## 2023-06-06 ENCOUNTER — Telehealth: Payer: Self-pay | Admitting: Neurology

## 2023-06-06 NOTE — Telephone Encounter (Signed)
appointment r/s due to conflict

## 2023-06-12 ENCOUNTER — Other Ambulatory Visit: Payer: Self-pay | Admitting: Obstetrics and Gynecology

## 2023-06-12 DIAGNOSIS — R911 Solitary pulmonary nodule: Secondary | ICD-10-CM

## 2023-06-14 ENCOUNTER — Ambulatory Visit: Payer: 59 | Admitting: Neurology

## 2023-06-28 ENCOUNTER — Encounter: Payer: Self-pay | Admitting: Neurology

## 2023-06-28 ENCOUNTER — Ambulatory Visit: Admitting: Neurology

## 2023-06-28 VITALS — BP 112/72 | HR 67 | Ht 60.0 in | Wt 140.0 lb

## 2023-06-28 DIAGNOSIS — M792 Neuralgia and neuritis, unspecified: Secondary | ICD-10-CM | POA: Diagnosis not present

## 2023-06-28 DIAGNOSIS — D6859 Other primary thrombophilia: Secondary | ICD-10-CM | POA: Diagnosis not present

## 2023-06-28 DIAGNOSIS — G44001 Cluster headache syndrome, unspecified, intractable: Secondary | ICD-10-CM | POA: Insufficient documentation

## 2023-06-28 DIAGNOSIS — G44049 Chronic paroxysmal hemicrania, not intractable: Secondary | ICD-10-CM | POA: Diagnosis not present

## 2023-06-28 DIAGNOSIS — M5412 Radiculopathy, cervical region: Secondary | ICD-10-CM | POA: Insufficient documentation

## 2023-06-28 MED ORDER — GABAPENTIN 100 MG PO CAPS: 100.0000 mg | ORAL_CAPSULE | Freq: Every evening | ORAL | 5 refills | Status: AC | PRN

## 2023-06-28 NOTE — Patient Instructions (Signed)
 ASSESSMENT AND PLAN 58 y.o. 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 All on the right midface. 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  I am looking for a vascular arterial loop close to the ganglion.   2) gabapentin to continue ,  this time regular 300 mg at night.   3)  I have not ordered a NCV and EMG for the right upper extremity- I may have to defer this test out of this office if the cervical MRI hasn't given an explanation. .    I plan to follow up either personally or through our NP within 3-4 months.   I would like to thank Pincus Sanes, MD and Pincus Sanes, Md [address] [place],  [place] [address] for allowing me to meet with and to take care of this pleasant patient.
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
## 2023-07-04 ENCOUNTER — Telehealth: Payer: Self-pay | Admitting: Neurology

## 2023-07-04 DIAGNOSIS — M792 Neuralgia and neuritis, unspecified: Secondary | ICD-10-CM

## 2023-07-04 DIAGNOSIS — G44001 Cluster headache syndrome, unspecified, intractable: Secondary | ICD-10-CM

## 2023-07-04 DIAGNOSIS — G44049 Chronic paroxysmal hemicrania, not intractable: Secondary | ICD-10-CM

## 2023-07-04 DIAGNOSIS — D6859 Other primary thrombophilia: Secondary | ICD-10-CM

## 2023-07-04 DIAGNOSIS — M5412 Radiculopathy, cervical region: Secondary | ICD-10-CM

## 2023-07-04 NOTE — Telephone Encounter (Signed)
 This note is on the brain MRI: Please let me know if you have a special trigeminal neuralgia protocol- that's what I would want for the right Trigeminal ganglion.   There is an MRI face/trigeminal you can order for this.

## 2023-07-04 NOTE — Telephone Encounter (Signed)
 I have placed the order for MRI face/trigeminal

## 2023-07-04 NOTE — Addendum Note (Signed)
 Addended by: Judi Cong on: 07/04/2023 08:12 AM   Modules accepted: Orders

## 2023-07-09 NOTE — Addendum Note (Signed)
 Addended by: Judi Cong on: 07/09/2023 07:31 AM   Modules accepted: Orders

## 2023-07-10 NOTE — Telephone Encounter (Signed)
 I have her appointment corrected.

## 2023-07-12 ENCOUNTER — Ambulatory Visit
Admission: RE | Admit: 2023-07-12 | Discharge: 2023-07-12 | Disposition: A | Discharge status: Home / Self Care | Payer: 59 | Source: Ambulatory Visit | Attending: Obstetrics and Gynecology | Admitting: Obstetrics and Gynecology

## 2023-07-12 ENCOUNTER — Encounter: Payer: Self-pay | Admitting: Neurology

## 2023-07-12 DIAGNOSIS — R911 Solitary pulmonary nodule: Secondary | ICD-10-CM

## 2023-07-16 ENCOUNTER — Telehealth: Payer: Self-pay | Admitting: Neurology

## 2023-07-16 NOTE — Telephone Encounter (Signed)
 Transferred call to MRI coordinator to reschedule.

## 2023-07-18 ENCOUNTER — Other Ambulatory Visit: Payer: Self-pay | Admitting: Neurology

## 2023-07-18 ENCOUNTER — Ambulatory Visit (INDEPENDENT_AMBULATORY_CARE_PROVIDER_SITE_OTHER)

## 2023-07-18 ENCOUNTER — Ambulatory Visit

## 2023-07-18 DIAGNOSIS — D6859 Other primary thrombophilia: Secondary | ICD-10-CM

## 2023-07-18 DIAGNOSIS — M792 Neuralgia and neuritis, unspecified: Secondary | ICD-10-CM

## 2023-07-18 DIAGNOSIS — G44049 Chronic paroxysmal hemicrania, not intractable: Secondary | ICD-10-CM

## 2023-07-18 DIAGNOSIS — G44001 Cluster headache syndrome, unspecified, intractable: Secondary | ICD-10-CM | POA: Diagnosis not present

## 2023-07-18 DIAGNOSIS — M5412 Radiculopathy, cervical region: Secondary | ICD-10-CM

## 2023-07-18 MED ORDER — GADOBENATE DIMEGLUMINE 529 MG/ML IV SOLN
13.0000 mL | Freq: Once | INTRAVENOUS | Status: AC | PRN
  Administered 2023-07-18: 13 mL via INTRAVENOUS

## 2023-07-20 ENCOUNTER — Encounter: Payer: Self-pay | Admitting: Neurology

## 2023-09-05 ENCOUNTER — Ambulatory Visit: Payer: 59 | Admitting: Neurology

## 2023-09-20 NOTE — Patient Instructions (Addendum)
       Medications changes include :   None        Return if symptoms worsen or fail to improve.

## 2023-09-20 NOTE — Progress Notes (Unsigned)
    Subjective:    Patient ID: Veronica Chen, female    DOB: [birth year], 58 y.o.   MRN: [id number]      HPI Veronica Chen is here for No chief complaint on file.   She is here for an acute visit for cold symptoms.   Her symptoms started   She is experiencing   She has tried taking       Medications and allergies reviewed with patient and updated if appropriate.  Current Outpatient Medications on File Prior to Visit  Medication Sig Dispense Refill   aspirin 81 MG tablet Take 81 mg by mouth daily.     b complex vitamins tablet Take 1 tablet by mouth daily.     FINACEA 15 % gel Apply 1 application topically daily.     Fluocinolone Acetonide 0.01 % OIL Instill 4 drops into both ears every night for 14 days. Then stop and use as needed for itching.     fluticasone  (FLONASE ) 50 MCG/ACT nasal spray Place 2 sprays into both nostrils daily. 16 g 0   gabapentin  (NEURONTIN ) 100 MG capsule Take 1-3 capsules (100-300 mg total) by mouth at bedtime as needed. 90 capsule 5   JUBLIA 10 % SOLN Apply topically daily.     metroNIDAZOLE (METROCREAM) 0.75 % cream      pantoprazole  (PROTONIX ) 40 MG tablet Take 1 tablet (40 mg total) by mouth daily. 30 tablet 3   VITAMIN D, CHOLECALCIFEROL, PO Take by mouth.     No current facility-administered medications on file prior to visit.    Review of Systems     Objective:  There were no vitals filed for this visit. BP Readings from Last 3 Encounters:  06/28/23 112/72  02/23/23 106/74  11/10/22 118/70   Wt Readings from Last 3 Encounters:  06/28/23 140 lb (63.5 kg)  02/23/23 136 lb (61.7 kg)  11/10/22 136 lb (61.7 kg)   There is no height or weight on file to calculate BMI.    Physical Exam Constitutional:      General: She is not in acute distress.    Appearance: Normal appearance. She is not ill-appearing.  HENT:     Head: Normocephalic and atraumatic.     Right Ear: Tympanic membrane, ear canal and external ear normal.     Left Ear:  Tympanic membrane, ear canal and external ear normal.     Mouth/Throat:     Mouth: Mucous membranes are moist.     Pharynx: No oropharyngeal exudate or posterior oropharyngeal erythema.  Eyes:     Conjunctiva/sclera: Conjunctivae normal.  Cardiovascular:     Rate and Rhythm: Normal rate and regular rhythm.  Pulmonary:     Effort: Pulmonary effort is normal. No respiratory distress.     Breath sounds: Normal breath sounds. No wheezing or rales.  Musculoskeletal:     Cervical back: Neck supple. No tenderness.  Lymphadenopathy:     Cervical: No cervical adenopathy.  Skin:    General: Skin is warm and dry.  Neurological:     Mental Status: She is alert.            Assessment & Plan:    See Problem List for Assessment and Plan of chronic medical problems.

## 2023-09-21 ENCOUNTER — Ambulatory Visit: Admitting: Internal Medicine

## 2023-09-21 ENCOUNTER — Encounter: Payer: Self-pay | Admitting: Internal Medicine

## 2023-09-21 VITALS — BP 110/76 | HR 68 | Temp 98.6°F | Ht 60.0 in | Wt 136.0 lb

## 2023-09-21 DIAGNOSIS — G500 Trigeminal neuralgia: Secondary | ICD-10-CM | POA: Diagnosis not present

## 2023-09-21 NOTE — Assessment & Plan Note (Signed)
 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

## 2023-09-25 ENCOUNTER — Encounter: Payer: Self-pay | Admitting: Neurology

## 2023-09-27 NOTE — Telephone Encounter (Signed)
 Spoke w/Pt regarding provider note about gabapentin . Pt stated her PCP recommended she take gabapentin  100 mg 1 to 2 during the day if she needed it and still take 3 at bedtime. Pt stated she is doing this and it has helped and wanted to know if Dr. Albertina Hugger thinks this is ok. Pt reported that she had been to two different concerts and had gotten her teeth cleaned before her jaw began to bother her so bad, so she felt that may have triggered it as she was having to talk really loud to those she was with during the concerts. Pt was also asking about the botox. Informed Pt provider was planning to discuss with 2 other providers in our practice to see if it had been beneficial to their patients. Made Pt aware either provider will reach out or have reach out to her regarding provider's decision on the botox use. Pt voiced understanding. Also informed Pt will send message to provider regarding how PCP advised Pt to take gabapentin . Pt voiced understanding and thankful for the call back.

## 2023-10-02 NOTE — Telephone Encounter (Signed)
 Spoke w/Pt to make her aware provider is in agreement with the gabapentin  dosing from PCP. Also informed Pt Botox injections for the jaw are not recommended and insurance would not cover. Pt voiced understanding and stated she is currently doing well on the gabapentin  dosing. Told Pt glad to hear she is doing well with that dosing. Pt voiced thanks for the follow up call.

## 2024-02-08 ENCOUNTER — Ambulatory Visit: Admitting: Podiatry

## 2024-02-08 DIAGNOSIS — B351 Tinea unguium: Secondary | ICD-10-CM | POA: Diagnosis not present

## 2024-02-08 NOTE — Progress Notes (Signed)
  Subjective:  Patient ID: Veronica Chen, female    DOB: [birth year],  MRN: [id number]  Chief Complaint  Patient presents with   Nail Problem    Pt stated that she has been dealing with nail fungus for a while now     58 y.o. female presents with the above complaint.  Patient presents with right 1 through 5 onychomycosis with thickened elongated dystrophic mycotic toenails x 5.  She wants to discuss treatment options for nail fungus she has tried topical medication.  Which has not helped she wishes to do laser therapy   Review of Systems: Negative except as noted in the HPI. Denies N/V/F/Ch.  Past Medical History:  Diagnosis Date   Congenital deficiency of other clotting factors    prot s defic, no hx DVT/VTE   ECZEMA    Palpitations    Protein S deficiency    PVC's (premature ventricular contractions)    THYROID  NODULE    Vertigo     Current Outpatient Medications:    ascorbic acid (VITAMIN C) 100 MG tablet, Take 100 mg by mouth., Disp: , Rfl:    aspirin 81 MG tablet, Take 81 mg by mouth daily., Disp: , Rfl:    gabapentin  (NEURONTIN ) 100 MG capsule, Take 1-3 capsules (100-300 mg total) by mouth at bedtime as needed., Disp: 90 capsule, Rfl: 5   JUBLIA 10 % SOLN, Apply topically daily., Disp: , Rfl:    metroNIDAZOLE (METROCREAM) 0.75 % cream, , Disp: , Rfl:    pantoprazole  (PROTONIX ) 40 MG tablet, Take 1 tablet (40 mg total) by mouth daily., Disp: 30 tablet, Rfl: 3   VITAMIN D, CHOLECALCIFEROL, PO, Take by mouth., Disp: , Rfl:   Social History   Tobacco Use  Smoking Status Never  Smokeless Tobacco Never  Tobacco Comments   Married, lives with spouse and 2 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 There is no height or weight on file to calculate BMI. Constitutional Well developed. Well nourished.  Vascular Dorsalis pedis pulses palpable bilaterally. Posterior tibial pulses palpable  bilaterally. Capillary refill normal to all digits.  No cyanosis or clubbing noted. Pedal hair growth normal.  Neurologic Normal speech. Oriented to person, place, and time. Epicritic sensation to light touch grossly present bilaterally.  Dermatologic Nails thickened onychodystrophy mycotic toenails x 5 right foot Skin within normal limits  Orthopedic: Normal joint ROM without pain or crepitus bilaterally. No visible deformities. No bony tenderness.   Radiographs: None Assessment:   1. Onychomycosis due to dermatophyte   2. Nail fungus    Plan:  Patient was evaluated and treated and all questions answered.  Right 1 through 5 onychomycosis -Educated the patient on the etiology of onychomycosis and various treatment options associated with improving the fungal load.  I explained to the patient that there is 3 treatment options available to treat the onychomycosis including topical, p.o., laser treatment.  Patient elected to go on laser therapy and encouraged her that it generally takes about 6-7 sessions.  She states understanding would like to proceed with laser.  She will be scheduled with laser technician.   No follow-ups on file.

## 2024-02-12 ENCOUNTER — Ambulatory Visit: Payer: Self-pay

## 2024-02-12 DIAGNOSIS — B351 Tinea unguium: Secondary | ICD-10-CM

## 2024-02-12 NOTE — Patient Instructions (Signed)

## 2024-02-12 NOTE — Progress Notes (Signed)
 Patient presents today for the 1st laser treatment. Diagnosed with mycotic nail infection by Dr. Tobie.   Toenail most affected right 1-5.  All other systems are negative.  Nails were filed thin. Laser therapy was administered to 1-5 toenails right foot and patient tolerated the treatment well. All safety precautions were in place.   Post treatment instructions reviewed and provided to patient. Patient had no questions regarding plan of care.   Follow up in 4 weeks for laser # 2.

## 2024-03-11 ENCOUNTER — Ambulatory Visit (INDEPENDENT_AMBULATORY_CARE_PROVIDER_SITE_OTHER): Payer: Self-pay | Admitting: Podiatry

## 2024-03-11 DIAGNOSIS — B351 Tinea unguium: Secondary | ICD-10-CM

## 2024-03-11 NOTE — Progress Notes (Signed)
 Patient presents today for the 2nd laser treatment. Diagnosed with mycotic nail infection by Dr. Tobie.   Toenail most affected 1-5 right foot.  All other systems are negative.  Nails were filed thin. Laser therapy was administered to 1-5 toenails right foot and patient tolerated the treatment well. All safety precautions were in place.   Post treatment instructions reviewed and provided to patient. Patient had no questions regarding plan of care.   Follow up in 4 weeks for laser # 3.

## 2024-04-10 ENCOUNTER — Ambulatory Visit

## 2024-05-02 ENCOUNTER — Other Ambulatory Visit: Payer: Self-pay

## 2024-05-02 ENCOUNTER — Encounter: Payer: Self-pay | Admitting: Internal Medicine

## 2024-05-02 ENCOUNTER — Telehealth: Payer: Self-pay | Admitting: Internal Medicine

## 2024-05-02 DIAGNOSIS — J208 Acute bronchitis due to other specified organisms: Secondary | ICD-10-CM

## 2024-05-02 MED ORDER — ALBUTEROL SULFATE HFA 108 (90 BASE) MCG/ACT IN AERS: 2.0000 | INHALATION_SPRAY | Freq: Four times a day (QID) | RESPIRATORY_TRACT | 2 refills | Status: AC | PRN

## 2024-05-02 NOTE — Telephone Encounter (Unsigned)
 Copied from CRM [id number]. Topic: Clinical - Medication Refill >> May 02, 2024  8:46 AM Carlyon D wrote: Medication: albuterol  (VENTOLIN  HFA) 108 (90 Base) MCG/ACT inhaler  Has the patient contacted their pharmacy? Yes (Agent: If no, request that the patient contact the pharmacy for the refill. If patient does not wish to contact the pharmacy document the reason why and proceed with request.) (Agent: If yes, when and what did the pharmacy advise?)  This is the patient's preferred pharmacy:  [id number] [address] [facility] - [address] [place] [address] Phone: [phone] Fax: [phone]  Is this the correct pharmacy for this prescription? Yes If no, delete pharmacy and type the correct one.   Has the prescription been filled recently? No  Is the patient out of the medication? Yes  Has the patient been seen for an appointment in the last year OR does the patient have an upcoming appointment? Yes  Can we respond through MyChart? Yes  Agent: Please be advised that Rx refills may take up to 3 business days. We ask that you follow-up with your pharmacy.

## 2024-05-05 NOTE — Telephone Encounter (Signed)
 Refill has already been sent

## 2024-05-07 ENCOUNTER — Encounter: Payer: Self-pay | Admitting: Internal Medicine

## 2024-05-07 NOTE — Patient Instructions (Addendum)
 "     Blood work was ordered.       Medications changes include :   None    A referral was ordered and someone will call you to schedule an appointment.     Return in about 1 year (around 05/08/2025) for Physical Exam.    Health Maintenance, Female Adopting a healthy lifestyle and getting preventive care are important in promoting health and wellness. Ask your health care provider about: The right schedule for you to have regular tests and exams. Things you can do on your own to prevent diseases and keep yourself healthy. What should I know about diet, weight, and exercise? Eat a healthy diet  Eat a diet that includes plenty of vegetables, fruits, low-fat dairy products, and lean protein. Do not eat a lot of foods that are high in solid fats, added sugars, or sodium. Maintain a healthy weight Body mass index (BMI) is used to identify weight problems. It estimates body fat based on height and weight. Your health care provider can help determine your BMI and help you achieve or maintain a healthy weight. Get regular exercise Get regular exercise. This is one of the most important things you can do for your health. Most adults should: Exercise for at least 150 minutes each week. The exercise should increase your heart rate and make you sweat (moderate-intensity exercise). Do strengthening exercises at least twice a week. This is in addition to the moderate-intensity exercise. Spend less time sitting. Even light physical activity can be beneficial. Watch cholesterol and blood lipids Have your blood tested for lipids and cholesterol at 58 years of age, then have this test every 5 years. Have your cholesterol levels checked more often if: Your lipid or cholesterol levels are high. You are older than 58 years of age. You are at high risk for heart disease. What should I know about cancer screening? Depending on your health history and family history, you may need to have cancer  screening at various ages. This may include screening for: Breast cancer. Cervical cancer. Colorectal cancer. Skin cancer. Lung cancer. What should I know about heart disease, diabetes, and high blood pressure? Blood pressure and heart disease High blood pressure causes heart disease and increases the risk of stroke. This is more likely to develop in people who have high blood pressure readings or are overweight. Have your blood pressure checked: Every 3-5 years if you are 73-56 years of age. Every year if you are 60 years old or older. Diabetes Have regular diabetes screenings. This checks your fasting blood sugar level. Have the screening done: Once every three years after age 21 if you are at a normal weight and have a low risk for diabetes. More often and at a younger age if you are overweight or have a high risk for diabetes. What should I know about preventing infection? Hepatitis B If you have a higher risk for hepatitis B, you should be screened for this virus. Talk with your health care provider to find out if you are at risk for hepatitis B infection. Hepatitis C Testing is recommended for: Everyone born from [birth year] through 1964. Anyone with known risk factors for hepatitis C. Sexually transmitted infections (STIs) Get screened for STIs, including gonorrhea and chlamydia, if: You are sexually active and are younger than 58 years of age. You are older than 58 years of age and your health care provider tells you that you are at risk for this type of infection. Your sexual  activity has changed since you were last screened, and you are at increased risk for chlamydia or gonorrhea. Ask your health care provider if you are at risk. Ask your health care provider about whether you are at high risk for HIV. Your health care provider may recommend a prescription medicine to help prevent HIV infection. If you choose to take medicine to prevent HIV, you should first get tested for HIV. You  should then be tested every 3 months for as long as you are taking the medicine. Pregnancy If you are about to stop having your period (premenopausal) and you may become pregnant, seek counseling before you get pregnant. Take 400 to 800 micrograms (mcg) of folic acid every day if you become pregnant. Ask for birth control (contraception) if you want to prevent pregnancy. Osteoporosis and menopause Osteoporosis is a disease in which the bones lose minerals and strength with aging. This can result in bone fractures. If you are 45 years old or older, or if you are at risk for osteoporosis and fractures, ask your health care provider if you should: Be screened for bone loss. Take a calcium or vitamin D supplement to lower your risk of fractures. Be given hormone replacement therapy (HRT) to treat symptoms of menopause. Follow these instructions at home: Alcohol use Do not drink alcohol if: Your health care provider tells you not to drink. You are pregnant, may be pregnant, or are planning to become pregnant. If you drink alcohol: Limit how much you have to: 0-1 drink a day. Know how much alcohol is in your drink. In the [place] one drink equals one 12 oz bottle of beer (355 mL), one 5 oz glass of wine (148 mL), or one 1 oz glass of hard liquor (44 mL). Lifestyle Do not use any products that contain nicotine or tobacco. These products include cigarettes, chewing tobacco, and vaping devices, such as e-cigarettes. If you need help quitting, ask your health care provider. Do not use street drugs. Do not share needles. Ask your health care provider for help if you need support or information about quitting drugs. General instructions Schedule regular health, dental, and eye exams. Stay current with your vaccines. Tell your health care provider if: You often feel depressed. You have ever been abused or do not feel safe at home. Summary Adopting a healthy lifestyle and getting preventive care are  important in promoting health and wellness. Follow your health care provider's instructions about healthy diet, exercising, and getting tested or screened for diseases. Follow your health care provider's instructions on monitoring your cholesterol and blood pressure. This information is not intended to replace advice given to you by your health care provider. Make sure you discuss any questions you have with your health care provider. Document Revised: 08/30/2020 Document Reviewed: 08/30/2020 Elsevier Patient Education  2024 [employer]. "

## 2024-05-07 NOTE — Progress Notes (Signed)
 "   Subjective:    Patient ID: Veronica Chen, female    DOB: 17-Oct-1965, 59 y.o.   MRN: [id number]      HPI Veronica Chen is here for a Physical exam and her chronic medical problems.      Medications and allergies reviewed with patient and updated if appropriate.  Medications Ordered Prior to Encounter[1]  Review of Systems     Objective:  There were no vitals filed for this visit. There were no vitals filed for this visit. There is no height or weight on file to calculate BMI.  BP Readings from Last 3 Encounters:  09/21/23 110/76  06/28/23 112/72  02/23/23 106/74    Wt Readings from Last 3 Encounters:  09/21/23 136 lb (61.7 kg)  06/28/23 140 lb (63.5 kg)  02/23/23 136 lb (61.7 kg)       Physical Exam Constitutional: She appears well-developed and well-nourished. No distress.  HENT:  Head: Normocephalic and atraumatic.  Right Ear: External ear normal. Normal ear canal and TM Left Ear: External ear normal.  Normal ear canal and TM Mouth/Throat: Oropharynx is clear and moist.  Eyes: Conjunctivae normal.  Neck: Neck supple. No tracheal deviation present. No thyromegaly present.  No carotid bruit  Cardiovascular: Normal rate, regular rhythm and normal heart sounds.   No murmur heard.  No edema. Pulmonary/Chest: Effort normal and breath sounds normal. No respiratory distress. She has no wheezes. She has no rales.  Breast: deferred   Abdominal: Soft. She exhibits no distension. There is no tenderness.  Lymphadenopathy: She has no cervical adenopathy.  Skin: Skin is warm and dry. She is not diaphoretic.  Psychiatric: She has a normal mood and affect. Her behavior is normal.     Lab Results  Component Value Date   WBC 6.2 11/17/2022   HGB 13.5 11/17/2022   HCT 41.1 11/17/2022   PLT 306.0 11/17/2022   GLUCOSE 82 11/17/2022   CHOL 171 12/27/2017   TRIG 90.0 12/27/2017   HDL 61.90 12/27/2017   LDLCALC 91 12/27/2017   ALT 14 11/17/2022   AST 19 11/17/2022   NA  139 11/17/2022   K 4.3 11/17/2022   CL 101 11/17/2022   CREATININE 0.90 11/17/2022   BUN 22 11/17/2022   CO2 30 11/17/2022   TSH 3.16 12/27/2017         Assessment & Plan:   Physical exam: Screening blood work  ordered Exercise   Weight   Substance abuse  none   Reviewed recommended immunizations.   Health Maintenance  Topic Date Due   Hepatitis C Screening  Never done   DTaP/Tdap/Td (1 - Tdap) Never done   Pneumococcal Vaccine: 50+ Years (1 of 2 - PCV) Never done   Hepatitis B Vaccines 19-59 Average Risk (1 of 3 - 19+ 3-dose series) Never done   Mammogram  04/07/2019   Cervical Cancer Screening (HPV/Pap Cotest)  04/05/2020   Bone Density Scan  09/08/2023   Influenza Vaccine  11/23/2023   COVID-19 Vaccine (4 - 2025-26 season) 12/24/2023   Fecal DNA (Cologuard)  05/19/2024   HIV Screening  12/27/2088 (Originally [birth year]1981)   HPV VACCINES (No Doses Required) Completed   Zoster Vaccines- Shingrix  Completed   Meningococcal B Vaccine  Aged Out          See Problem List for Assessment and Plan of chronic medical problems.          [1] Current Outpatient Medications on File Prior to Visit  Medication Sig  Dispense Refill   albuterol  (VENTOLIN  HFA) 108 (90 Base) MCG/ACT inhaler Inhale 2 puffs into the lungs every 6 (six) hours as needed for wheezing or shortness of breath. 8 g 2   ascorbic acid (VITAMIN C) 100 MG tablet Take 100 mg by mouth.     aspirin 81 MG tablet Take 81 mg by mouth daily.     gabapentin  (NEURONTIN ) 100 MG capsule Take 1-3 capsules (100-300 mg total) by mouth at bedtime as needed. 90 capsule 5   JUBLIA 10 % SOLN Apply topically daily.     metroNIDAZOLE (METROCREAM) 0.75 % cream      pantoprazole  (PROTONIX ) 40 MG tablet Take 1 tablet (40 mg total) by mouth daily. 30 tablet 3   VITAMIN D, CHOLECALCIFEROL, PO Take by mouth.     No current facility-administered medications on file prior to visit.  This encounter was  created in error - please disregard. "

## 2024-05-08 ENCOUNTER — Encounter: Admitting: Internal Medicine

## 2024-05-13 ENCOUNTER — Ambulatory Visit (INDEPENDENT_AMBULATORY_CARE_PROVIDER_SITE_OTHER)

## 2024-05-13 DIAGNOSIS — B351 Tinea unguium: Secondary | ICD-10-CM

## 2024-05-13 NOTE — Progress Notes (Signed)
 Patient presents today for the 3rd laser treatment. Diagnosed with mycotic nail infection by Dr. Tobie.   Toenail most affected Right foot 1-5, 2nd is darker than others.  All other systems are negative.  Nails were filed thin. Laser therapy was administered to Right toenails 1-5 and patient tolerated the treatment well. All safety precautions were in place.   Post treatment instructions reviewed and provided to patient. Patient had no questions regarding plan of care.   Follow up in 6 weeks for laser # 4.

## 2024-06-24 ENCOUNTER — Ambulatory Visit

## 2024-08-20 ENCOUNTER — Encounter: Admitting: Internal Medicine
